# Patient Record
Sex: Female | Born: 1980 | Race: White | Hispanic: No | Marital: Single | Smoking: Current every day smoker
Health system: Southern US, Community
[De-identification: ages and names within clinical notes are randomized; demographics above are authoritative.]

## PROBLEM LIST (undated history)

## (undated) DIAGNOSIS — R51 Headache: Secondary | ICD-10-CM

## (undated) DIAGNOSIS — R519 Headache, unspecified: Secondary | ICD-10-CM

## (undated) DIAGNOSIS — G8929 Other chronic pain: Secondary | ICD-10-CM

## (undated) DIAGNOSIS — M542 Cervicalgia: Secondary | ICD-10-CM

## (undated) DIAGNOSIS — M549 Dorsalgia, unspecified: Secondary | ICD-10-CM

## (undated) HISTORY — PX: SHOULDER SURGERY: SHX246

## (undated) HISTORY — PX: CHOLECYSTECTOMY: SHX55

---

## 2013-11-14 ENCOUNTER — Ambulatory Visit (INDEPENDENT_AMBULATORY_CARE_PROVIDER_SITE_OTHER): Payer: BC Managed Care – PPO | Admitting: Family Medicine

## 2013-11-14 VITALS — BP 110/80 | HR 93 | Temp 98.3°F | Resp 16 | Ht 65.0 in | Wt 141.4 lb

## 2013-11-14 DIAGNOSIS — IMO0002 Reserved for concepts with insufficient information to code with codable children: Secondary | ICD-10-CM

## 2013-11-14 DIAGNOSIS — R200 Anesthesia of skin: Secondary | ICD-10-CM

## 2013-11-14 DIAGNOSIS — R209 Unspecified disturbances of skin sensation: Secondary | ICD-10-CM

## 2013-11-14 DIAGNOSIS — M792 Neuralgia and neuritis, unspecified: Secondary | ICD-10-CM | POA: Insufficient documentation

## 2013-11-14 MED ORDER — TRAMADOL HCL 50 MG PO TABS: 50.0000 mg | ORAL_TABLET | Freq: Three times a day (TID) | ORAL | Status: DC | PRN

## 2013-11-14 MED ORDER — METHYLPREDNISOLONE (PAK) 4 MG PO TABS: ORAL_TABLET | ORAL | Status: DC

## 2013-11-14 NOTE — Progress Notes (Signed)
This chart was scribed for Elvina Sidle, MD by Charline Bills, ED Scribe. The patient was seen in room 8. Patient's care was started at 9:27 AM.  Patient ID: Elizabeth Wong MRN: [id number], DOB: 1980-11-06, 33 y.o. Date of Encounter: 11/14/2013, 9:27 AM  Primary Physician: No PCP Per Patient  Chief Complaint  Patient presents with   Numbness    X 3 days, right shoulder down to right foot    HPI: 33 y.o. year old female with history below presents with intermittent R-sided pain over the past 2 years, returned 3 days ago. Pt states that she has pain from her R arm down to R knee. Pt reports dropping objects due to severity of pain. She also reports associated R-sided numbness, back pain, neck pain and HA.She also reports weakness and states that she has dropped objects before due to symptoms.   Pt states that she has followed up with her PCP for similar symptoms who placed her on Gabapentin with no relief. She has also tried ibuprofen, neurontin, cold compresses and warm baths with no relief. Pt reports waking up due to severity of pain.   She has been out of work for 3 days due to pain. Pt denies neck injury. She also denies family h/o similar symptoms. Pt has not seen a neurologist for symptoms. No h/o substance abuse. No h/o unusual infections.   Pt works at a [employer].   History reviewed. No pertinent past medical history.   Home Meds: Prior to Admission medications   Not on File    Allergies: No Known Allergies  History   Social History   Marital Status: Single    Spouse Name: N/A    Number of Children: N/A   Years of Education: N/A   Occupational History   Not on file.   Social History Main Topics   Smoking status: Current Every Day Smoker   Smokeless tobacco: Not on file   Alcohol Use: Yes   Drug Use: Not on file   Sexual Activity: Not on file   Other Topics Concern   Not on file   Social History Narrative   No narrative on file       Review of Systems: Constitutional: negative for chills, fever, night sweats, weight changes, or fatigue  HEENT: negative for vision changes, hearing loss, congestion, rhinorrhea, ST, epistaxis, or sinus pressure Cardiovascular: negative for chest pain or palpitations Respiratory: negative for hemoptysis, wheezing, shortness of breath, or cough Musculoskeletal: +neck pain, +back pain, +myalgias Abdominal: negative for abdominal pain, nausea, vomiting, diarrhea, or constipation Dermatological: negative for rash Neurologic: negative for dizziness, or syncope, +headache, +weakness, +numbness All other systems reviewed and are otherwise negative with the exception to those above and in the HPI.   Physical Exam: Blood pressure 110/80, pulse 93, temperature 98.3 F (36.8 C), temperature source Oral, resp. rate 16, height  (1.651 m), weight 141 lb 6.4 oz (64.139 kg), last menstrual period 10/23/2013, SpO2 100.00%., Body mass index is 23.53 kg/(m^2). General: Well developed, well nourished, in no acute distress. Head: Normocephalic, atraumatic, eyes without discharge, sclera non-icteric, nares are without discharge. Bilateral auditory canals clear, TM's are without perforation, pearly grey and translucent with reflective cone of light bilaterally. Oral cavity moist, posterior pharynx without exudate, erythema, peritonsillar abscess, or post nasal drip.  Neck: Supple. No thyromegaly. Full ROM. No lymphadenopathy. Lungs: Clear bilaterally to auscultation without wheezes, rales, or rhonchi. Breathing is unlabored. Heart: RRR with S1 S2. No murmurs, rubs, or  gallops appreciated. Abdomen: Soft, non-tender, non-distended with normoactive bowel sounds. No hepatomegaly. No rebound/guarding. No obvious abdominal masses. Msk:  Strength and tone normal for age Extremities/Skin: Warm and dry. No clubbing or cyanosis. No edema. No rashes or suspicious lesions. Neuro: Alert and oriented X 3. Moves all  extremities spontaneously. Gait is normal. CNII-XII grossly in tact, reflexes are normal Psych:  Responds to questions appropriately with a normal affect.   Labs:   ASSESSMENT AND PLAN:  33 y.o. year old female with   1. Hemisensory loss   2. Neuropathic pain   Hemisensory loss - Plan: MR Cervical Spine Wo Contrast, methylPREDNIsolone (MEDROL DOSPACK) 4 MG tablet, Ambulatory referral to Neurology  Neuropathic pain - Plan: MR Cervical Spine Wo Contrast, methylPREDNIsolone (MEDROL DOSPACK) 4 MG tablet, Ambulatory referral to Neurology, traMADol (ULTRAM) 50 MG tablet   Signed, Elvina Sidle, MD 11/14/2013 9:28 AM

## 2013-11-20 ENCOUNTER — Encounter: Payer: Self-pay | Admitting: Neurology

## 2013-11-20 ENCOUNTER — Telehealth: Payer: Self-pay | Admitting: *Deleted

## 2013-11-20 ENCOUNTER — Ambulatory Visit (INDEPENDENT_AMBULATORY_CARE_PROVIDER_SITE_OTHER): Payer: BC Managed Care – PPO | Admitting: Neurology

## 2013-11-20 ENCOUNTER — Telehealth: Payer: Self-pay

## 2013-11-20 VITALS — BP 103/67 | HR 76 | Ht 65.0 in | Wt 144.8 lb

## 2013-11-20 DIAGNOSIS — M79609 Pain in unspecified limb: Secondary | ICD-10-CM

## 2013-11-20 DIAGNOSIS — IMO0002 Reserved for concepts with insufficient information to code with codable children: Secondary | ICD-10-CM

## 2013-11-20 DIAGNOSIS — M79631 Pain in right forearm: Secondary | ICD-10-CM

## 2013-11-20 DIAGNOSIS — R209 Unspecified disturbances of skin sensation: Secondary | ICD-10-CM

## 2013-11-20 DIAGNOSIS — M792 Neuralgia and neuritis, unspecified: Secondary | ICD-10-CM

## 2013-11-20 DIAGNOSIS — R200 Anesthesia of skin: Secondary | ICD-10-CM

## 2013-11-20 MED ORDER — TOPIRAMATE 25 MG PO TABS: 25.0000 mg | ORAL_TABLET | Freq: Two times a day (BID) | ORAL | Status: DC

## 2013-11-20 MED ORDER — TRAMADOL HCL 50 MG PO TABS: 100.0000 mg | ORAL_TABLET | Freq: Four times a day (QID) | ORAL | Status: DC | PRN

## 2013-11-20 NOTE — Telephone Encounter (Signed)
Patient wanted to know by tomorrow if its still okay to get her MRI tomorrow? She is saying that she is seeing a neurologist that Dr. Milus Glazier referred her to. She was unsure her appointment is today to see the neurologist. I told her I was not sure what Dr. Milus Glazier wanted you to do both or not. I told her to also verify with neurologist she is seeing today. Please call as soon as possible her MRI is tomorrow at 6  Best: [phone]

## 2013-11-20 NOTE — Patient Instructions (Signed)
I had a long discussion with the patient with regards to her intermittent right body pain and paresthesias, discussed results of my examination, differential diagnosis, plan for evaluation and answered questions. I recommend she increase Ultram to 100 mg every 6 hours he when necessary and start Topamax 25 mg twice daily increase as tolerated to help with the pain and paresthesias. Check MRI scan of the brain in addition to MRI scan of the neck which is already scheduled for tomorrow. Check ANA panel, ESR, B12 and TSH. Return for followup in 6 weeks or call earlier if necessary.  Paresthesia Paresthesia is an abnormal burning or prickling sensation. This sensation is generally felt in the hands, arms, legs, or feet. However, it may occur in any part of the body. It is usually not painful. The feeling may be described as:  Tingling or numbness.  "Pins and needles."  Skin crawling.  Buzzing.  Limbs "falling asleep."  Itching. Most people experience temporary (transient) paresthesia at some time in their lives. CAUSES  Paresthesia may occur when you breathe too quickly (hyperventilation). It can also occur without any apparent cause. Commonly, paresthesia occurs when pressure is placed on a nerve. The feeling quickly goes away once the pressure is removed. For some people, however, paresthesia is a long-lasting (chronic) condition caused by an underlying disorder. The underlying disorder may be:  A traumatic, direct injury to nerves. Examples include a:  Broken (fractured) neck.  Fractured skull.  A disorder affecting the brain and spinal cord (central nervous system). Examples include:  Transverse myelitis.  Encephalitis.  Transient ischemic attack.  Multiple sclerosis.  Stroke.  Tumor or blood vessel problems, such as an arteriovenous malformation pressing against the brain or spinal cord.  A condition that damages the peripheral nerves (peripheral neuropathy). Peripheral  nerves are not part of the brain and spinal cord. These conditions include:  Diabetes.  Peripheral vascular disease.  Nerve entrapment syndromes, such as carpal tunnel syndrome.  Shingles.  Hypothyroidism.  Vitamin B12 deficiencies.  Alcoholism.  Heavy metal poisoning (lead, arsenic).  Rheumatoid arthritis.  Systemic lupus erythematosus. DIAGNOSIS  Your caregiver will attempt to find the underlying cause of your paresthesia. Your caregiver may:  Take your medical history.  Perform a physical exam.  Order various lab tests.  Order imaging tests. TREATMENT  Treatment for paresthesia depends on the underlying cause. HOME CARE INSTRUCTIONS  Avoid drinking alcohol.  You may consider massage or acupuncture to help relieve your symptoms.  Keep all follow-up appointments as directed by your caregiver. SEEK IMMEDIATE MEDICAL CARE IF:   You feel weak.  You have trouble walking or moving.  You have problems with speech or vision.  You feel confused.  You cannot control your bladder or bowel movements.  You feel numbness after an injury.  You faint.  Your burning or prickling feeling gets worse when walking.  You have pain, cramps, or dizziness.  You develop a rash. MAKE SURE YOU:  Understand these instructions.  Will watch your condition.  Will get help right away if you are not doing well or get worse. Document Released: 02/25/2002 Document Revised: 05/30/2011 Document Reviewed: 11/26/2010 [facility] Patient Information 2015 [place], [place]. This information is not intended to replace advice given to you by your health care provider. Make sure you discuss any questions you have with your health care provider.

## 2013-11-20 NOTE — Telephone Encounter (Signed)
Patient wanted to scheduled the Mri Dr. Pearlean Brownie  Wanted her to have with the one she was getting tomorrow.  Called  [facility] they stated that will be a two hour test so i got her rescheduled for 11th @ 5:45 . Called patient and gave her the appointment and she showed understanding.

## 2013-11-20 NOTE — Telephone Encounter (Signed)
Lm for rtn call if pt has any further questions.  Pt has MRI scheduled 9/11 for MRI- Neurology is following pt.

## 2013-11-21 ENCOUNTER — Inpatient Hospital Stay: Admission: RE | Admit: 2013-11-21 | Payer: Self-pay | Source: Ambulatory Visit

## 2013-11-21 LAB — COMPREHENSIVE METABOLIC PANEL WITH GFR
ALT: 11 [IU]/L (ref 0–32)
AST: 10 [IU]/L (ref 0–40)
Albumin/Globulin Ratio: 1.8 (ref 1.1–2.5)
Albumin: 4.1 g/dL (ref 3.5–5.5)
Alkaline Phosphatase: 39 [IU]/L (ref 39–117)
BUN/Creatinine Ratio: 18 (ref 8–20)
BUN: 16 mg/dL (ref 6–20)
CO2: 26 mmol/L (ref 18–29)
Calcium: 8.8 mg/dL (ref 8.7–10.2)
Chloride: 100 mmol/L (ref 97–108)
Creatinine, Ser: 0.89 mg/dL (ref 0.57–1.00)
GFR calc Af Amer: 98 mL/min/{1.73_m2}
GFR calc non Af Amer: 85 mL/min/{1.73_m2}
Globulin, Total: 2.3 g/dL (ref 1.5–4.5)
Glucose: 85 mg/dL (ref 65–99)
Potassium: 4.3 mmol/L (ref 3.5–5.2)
Sodium: 139 mmol/L (ref 134–144)
Total Bilirubin: 0.3 mg/dL (ref 0.0–1.2)
Total Protein: 6.4 g/dL (ref 6.0–8.5)

## 2013-11-21 LAB — CBC
HCT: 41.7 % (ref 34.0–46.6)
Hemoglobin: 14 g/dL (ref 11.1–15.9)
MCH: 32.2 pg (ref 26.6–33.0)
MCHC: 33.6 g/dL (ref 31.5–35.7)
MCV: 96 fL (ref 79–97)
Platelets: 265 10*3/uL (ref 150–379)
RBC: 4.35 x10E6/uL (ref 3.77–5.28)
RDW: 12.7 % (ref 12.3–15.4)
WBC: 9.5 10*3/uL (ref 3.4–10.8)

## 2013-11-21 LAB — SEDIMENTATION RATE: Sed Rate: 2 mm/hr (ref 0–32)

## 2013-11-21 LAB — ANA: Anti Nuclear Antibody(ANA): NEGATIVE

## 2013-11-21 LAB — VITAMIN B12: Vitamin B-12: 249 pg/mL (ref 211–946)

## 2013-11-21 LAB — TSH: TSH: 1.06 u[IU]/mL (ref 0.450–4.500)

## 2013-11-21 NOTE — Progress Notes (Signed)
[facility] [address] [place]. [place] [address] [phone]       OFFICE CONSULT NOTE  [name] Date of Birth:  02/23/1981 Medical Record Number:  [id number]   Referring MD:  Elizabeth Wong  Reason for Referral:  Right-sided numbness and pain  HPI: 75 year Caucasian lady  Who has had intermittent right sided pain and paresthesias for the last couple of years. She states her symptoms started back in [place] where she lived and has recently moved to [place] area. She is unable to give me any specific details about name of her doctors and testing that she has gone through. She was initially having intermittent pain and numbness episodes on the right side which began with pain involving the right cerebral neck and quickly involving the right arm as well as left. Most of the time the pain is an ache occasionally it is sharp and shooting pain particularly from the waist down. Sharp and last only a few seconds acetal pain can last for minutes to hours. She denies any weakness in her hand or legs. She also complains of numbness on the right side which is intermittent. She is unable to identify specific triggers or relieving factors. She denies any complaints symptoms in the form of headaches, double vision, vertigo, loss of vision, blurred vision. She has not noticed any trouble with her walking or balance or had any falls. She works 2 full-time jobs. She has an MRI scan of the cervical spine scheduled to be done tomorrow. She has not had any x-rays or other imaging studies done. She has never visited a pain specialist or neurologist. She has been started on Ultram but she has been taking only 50 mg 3 times a day with only suboptimal relief. She denies any prior history of strokes, TIAs, seizures or significant head injury or spinal injury.  ROS:   14 system review of systems is positive for leg swelling, headache, numbness, weakness, pain and all other systems  negative PMH: No past medical history on file.  Social History:  History   Social History  . Marital Status: Single    Spouse Name: N/A    Number of Children: 3  . Years of Education: college   Occupational History  . N/A    Social History Main Topics  . Smoking status: Current Every Day Smoker  . Smokeless tobacco: Not on file  . Alcohol Use: Yes  . Drug Use: No  . Sexual Activity: Yes   Other Topics Concern  . Not on file   Social History Narrative  . No narrative on file    Medications:   Current Outpatient Prescriptions on File Prior to Visit  Medication Sig Dispense Refill  . methylPREDNIsolone (MEDROL DOSPACK) 4 MG tablet follow package directions  21 tablet  0   No current facility-administered medications on file prior to visit.    Allergies:  No Known Allergies  Physical Exam General: well developed, well nourished, seated, in no evident distress Head: head normocephalic and atraumatic. Orohparynx benign Neck: supple with no carotid or supraclavicular bruits Cardiovascular: regular rate and rhythm, no murmurs Musculoskeletal: no deformity. Few spots of point tenderness right supra-spinatus , right buttock and paraspinal. Skin:  no rash/petichiae Vascular:  Normal pulses all extremities Filed Vitals:   11/20/13 1515  BP: 103/67  Pulse: 76    Neurologic Exam Mental Status: Awake and fully alert. Oriented to place and time. Recent and remote memory intact. Attention span, concentration  and fund of knowledge appropriate. Mood and affect appropriate.  Cranial Nerves: Fundoscopic exam reveals sharp disc margins. Pupils equal, briskly reactive to light. No afferent pupillary defect. Extraocular movements full without nystagmus. Visual fields full to confrontation. Hearing intact. Facial sensation intact. Face, tongue, palate moves normally and symmetrically.  Motor: Normal bulk and tone. Normal strength in all tested extremity muscles. Sensory.: Diminished  touch and pin prick sensation in the right arm and leg with splitting of the midline. Diminished vibration on the right side as well. Position sense is intact. Romberg sign is negative..  Coordination: Rapid alternating movements normal in all extremities. Finger-to-nose and heel-to-shin performed accurately bilaterally. Gait and Station: Arises from chair without difficulty. Stance is normal. Gait demonstrates normal stride length and balance . Able to heel, toe and tandem walk without difficulty.  Reflexes: 2+ brisk and symmetric. Toes downgoing.     ASSESSMENT: 89 year Caucasian lady with history of intermittent right-sided pain and paresthesias of unclear etiology. Neurological exam is significant only for brisk hyperreflexia and right hemisensory loss with slight nonorganic features. Evaluation for structural left brain lesion, cervical spine compressive disease, vasculitic and demyelinating disorders is necessary    PLAN: I had a long discussion with the patient with regards to her intermittent right body pain and paresthesias, discussed results of my examination, differential diagnosis, plan for evaluation and answered questions. I recommend she increase Ultram to 100 mg every 6 hours he when necessary and start Topamax 25 mg twice daily increase as tolerated to help with the pain and paresthesias. Check MRI scan of the brain in addition to MRI scan of the neck which is already scheduled for tomorrow. Check ANA panel, ESR, B12 and TSH. Return for followup in 6 weeks or call earlier if necessary.    Note: This document was prepared with digital dictation and possible smart phrase technology. Any transcriptional errors that result from this process are unintentional.

## 2013-11-22 ENCOUNTER — Telehealth: Payer: Self-pay

## 2013-11-22 NOTE — Telephone Encounter (Signed)
[facility] notified us they have approved our request for coverage on Topiramate effective until 11/21/2016 Ref # [id number]

## 2013-11-26 ENCOUNTER — Telehealth: Payer: Self-pay | Admitting: *Deleted

## 2013-11-26 DIAGNOSIS — M792 Neuralgia and neuritis, unspecified: Secondary | ICD-10-CM

## 2013-11-26 MED ORDER — TRAMADOL HCL 50 MG PO TABS: 100.0000 mg | ORAL_TABLET | Freq: Four times a day (QID) | ORAL | Status: DC | PRN

## 2013-11-26 NOTE — Telephone Encounter (Signed)
Pt calling, pharmacy has not gotten prescription.  I faxed to [phone]. Pt aware.

## 2013-11-26 NOTE — Telephone Encounter (Addendum)
I called pt and she just picked up the topamax.  She is ok with the refill on topamax.   I reordered, please sign.  Thanks

## 2013-11-26 NOTE — Telephone Encounter (Signed)
She just saw Dr Pearlean Brownie last week and needs to give the topamax time to work. If she needs a refill of the tramadol I can provide that but I am not adding anything new when she has not even tried the medication she was prescribed by Dr Pearlean Brownie.

## 2013-11-26 NOTE — Telephone Encounter (Signed)
Pt called and is having severe pain, waist down, bilateral legs.  Saw Dr.Sethi for neuropathic pain?  Work up in progress.  Took tramadol 2 tabs every 6 hours  ( takes the edge off).  Trouble getting topamax, lloks from note from [facility]/pharm on 11-22-13 approval given.   I called pharmacy and she can get today and start.  She feels like needs something else other then tramadol?  Or another refill since starting topamax?   Was crying yesterday was in so much pain.

## 2013-11-26 NOTE — Telephone Encounter (Signed)
I called pt back and let her know that topamax has been approved.  She can pick up at pharmacy.   She still wants something else.

## 2013-11-29 ENCOUNTER — Ambulatory Visit
Admission: RE | Admit: 2013-11-29 | Discharge: 2013-11-29 | Disposition: A | Discharge status: Home / Self Care | Payer: BC Managed Care – PPO | Source: Ambulatory Visit | Attending: Family Medicine | Admitting: Family Medicine

## 2013-11-29 ENCOUNTER — Telehealth: Payer: Self-pay | Admitting: *Deleted

## 2013-11-29 ENCOUNTER — Ambulatory Visit
Admission: RE | Admit: 2013-11-29 | Discharge: 2013-11-29 | Disposition: A | Discharge status: Home / Self Care | Payer: BC Managed Care – PPO | Source: Ambulatory Visit | Attending: Neurology | Admitting: Neurology

## 2013-11-29 DIAGNOSIS — R200 Anesthesia of skin: Secondary | ICD-10-CM

## 2013-11-29 DIAGNOSIS — M792 Neuralgia and neuritis, unspecified: Secondary | ICD-10-CM

## 2013-11-29 DIAGNOSIS — R209 Unspecified disturbances of skin sensation: Secondary | ICD-10-CM

## 2013-11-29 MED ORDER — GADOBENATE DIMEGLUMINE 529 MG/ML IV SOLN
13.0000 mL | Freq: Once | INTRAVENOUS | Status: AC | PRN
  Administered 2013-11-29: 13 mL via INTRAVENOUS

## 2013-11-29 MED ORDER — TRAMADOL HCL 50 MG PO TABS: 50.0000 mg | ORAL_TABLET | Freq: Three times a day (TID) | ORAL | Status: DC | PRN

## 2013-11-29 NOTE — Telephone Encounter (Signed)
Pt calling is asking for another refill of tramadol to get her thru the weekend.   She has started the topamax since 11-26-13 and she has noted she is better, bearable.

## 2013-11-29 NOTE — Telephone Encounter (Signed)
Rx was signed and faxed to the pharmacy.  Confirmation time stamped 4:39pm

## 2013-11-29 NOTE — Telephone Encounter (Signed)
Chart reviewed, she was given Tramadol  ii tabs every 6 hours, 30 tabs in Sep 3rd 2015.   Will give her Tramadol  one po q 8hrs, x 60 tabs, no refill, keep follow up in Oct 14

## 2013-11-29 NOTE — Telephone Encounter (Signed)
I called the patient back.  Got no answer.  Left message relaying Dr Yan's note.   

## 2013-12-02 ENCOUNTER — Other Ambulatory Visit: Payer: Self-pay | Admitting: Family Medicine

## 2013-12-02 ENCOUNTER — Telehealth: Payer: Self-pay | Admitting: *Deleted

## 2013-12-02 DIAGNOSIS — M542 Cervicalgia: Secondary | ICD-10-CM

## 2013-12-02 MED ORDER — GABAPENTIN 100 MG PO CAPS: 100.0000 mg | ORAL_CAPSULE | Freq: Every day | ORAL | Status: DC

## 2013-12-02 NOTE — Telephone Encounter (Signed)
Increase topamax to 50 mg twice daily

## 2013-12-02 NOTE — Telephone Encounter (Signed)
Pt continues with pain issues.  Given refill for tramadol  1 tab q 8 hrs #60.  Pt stated that this did not ease pain like the original order for 2 tabs every 6 hrs.  This is her third prescription for tramadol    She is taking the topamax  po bid.   Please advise.

## 2013-12-03 ENCOUNTER — Telehealth: Payer: Self-pay

## 2013-12-03 DIAGNOSIS — M792 Neuralgia and neuritis, unspecified: Secondary | ICD-10-CM

## 2013-12-03 MED ORDER — TRAMADOL HCL 50 MG PO TABS
50.0000 mg | ORAL_TABLET | Freq: Three times a day (TID) | ORAL | Status: DC | PRN
Start: 2013-12-03 — End: 2013-12-04

## 2013-12-03 NOTE — Telephone Encounter (Signed)
The MRI of the brain shows no significant abnormality.  The neck MRI shows a very mild swelling of the disc.  These findings can cause some neck stiffness, but do not account for her mental status changes.  I have written for some pain medicine for the neck.  If she becomes disoriented again, she needs to go to the emergency department

## 2013-12-03 NOTE — Telephone Encounter (Signed)
Patient calling regarding pain management. Patient wants to know if her provider received MRI results and also if he can give her tramadol for daytime use and something for the evening pain as well. Patient also wants to let provider know that when she turned her neck yesterday she forgot what she was doing/where she was at. Please return call. CB # [phone]

## 2013-12-03 NOTE — Telephone Encounter (Signed)
Calling about MRI results (address pain med tramadol).

## 2013-12-03 NOTE — Telephone Encounter (Signed)
Patient calling again to check on what she should do regarding her Tramadol medication, also is requesting MRI results. Please call and advise.

## 2013-12-04 NOTE — Telephone Encounter (Signed)
Spoke to pt and found that the tramadol was refilled for #60 yesterday through the neurologist. Advised pt that she can not have two offices manage her pain medication. Advised her to contact the neurologist office they have many notes in the system in regards to pain management. She states she will contact them.  I have destroyed the script for Tramadol written by Dr. Milus Glazier.

## 2013-12-04 NOTE — Telephone Encounter (Signed)
I called pt and and relayed that Dr. Pearlean Brownie has not addressed.  Was not able to get him prior to calling you back.  I relayed that topamax  po bid is what he stated in note.  Did not address tramadol or MRI.  Will ask him and let her know.   She related

## 2013-12-04 NOTE — Telephone Encounter (Signed)
I spoke to the patient and discuss results of MRI of the brain and cervical spine. She continues to have right-sided neck pain which is not relieved with tramadol and Topamax. Recommend referral to pain clinic and she is agreeable.

## 2013-12-05 ENCOUNTER — Other Ambulatory Visit: Payer: Self-pay | Admitting: *Deleted

## 2013-12-05 DIAGNOSIS — IMO0002 Reserved for concepts with insufficient information to code with codable children: Secondary | ICD-10-CM

## 2013-12-05 NOTE — Telephone Encounter (Signed)
I placed order for PM referral.

## 2013-12-05 NOTE — Progress Notes (Signed)
Per note from Dr. Pearlean Brownie, order for referral for Pain Management.

## 2013-12-11 ENCOUNTER — Other Ambulatory Visit: Payer: Self-pay | Admitting: Physical Medicine and Rehabilitation

## 2013-12-11 DIAGNOSIS — M545 Low back pain, unspecified: Secondary | ICD-10-CM

## 2013-12-17 ENCOUNTER — Other Ambulatory Visit: Payer: BC Managed Care – PPO

## 2013-12-18 ENCOUNTER — Telehealth: Payer: Self-pay

## 2013-12-18 DIAGNOSIS — M792 Neuralgia and neuritis, unspecified: Secondary | ICD-10-CM

## 2013-12-18 MED ORDER — DICLOFENAC SODIUM 75 MG PO TBEC
75.0000 mg | DELAYED_RELEASE_TABLET | Freq: Every morning | ORAL | Status: DC
Start: 2013-12-18 — End: 2015-10-12

## 2013-12-18 NOTE — Telephone Encounter (Signed)
DR. L - Pt said you told her to call back in regards to pain medication.  She said she had been doing ok until the past 4-5 days.  Says aleve and other similar medications are not helping.  The gabapentin helps at night, but she needs something during the day for the pain.  Please call [phone]

## 2013-12-18 NOTE — Telephone Encounter (Signed)
Pt advised.

## 2013-12-21 ENCOUNTER — Ambulatory Visit
Admission: RE | Admit: 2013-12-21 | Discharge: 2013-12-21 | Disposition: A | Discharge status: Home / Self Care | Payer: BC Managed Care – PPO | Source: Ambulatory Visit | Attending: Physical Medicine and Rehabilitation | Admitting: Physical Medicine and Rehabilitation

## 2013-12-21 DIAGNOSIS — M545 Low back pain, unspecified: Secondary | ICD-10-CM

## 2014-01-01 ENCOUNTER — Ambulatory Visit: Payer: Self-pay | Admitting: Neurology

## 2014-02-08 ENCOUNTER — Encounter (HOSPITAL_COMMUNITY): Payer: Self-pay | Admitting: Emergency Medicine

## 2014-02-08 ENCOUNTER — Emergency Department (HOSPITAL_COMMUNITY)
Admission: EM | Admit: 2014-02-08 | Discharge: 2014-02-09 | Disposition: A | Discharge status: Home / Self Care | Payer: BC Managed Care – PPO | Attending: Emergency Medicine | Admitting: Emergency Medicine

## 2014-02-08 DIAGNOSIS — Z79899 Other long term (current) drug therapy: Secondary | ICD-10-CM | POA: Insufficient documentation

## 2014-02-08 DIAGNOSIS — Z720 Tobacco use: Secondary | ICD-10-CM | POA: Diagnosis not present

## 2014-02-08 DIAGNOSIS — R509 Fever, unspecified: Secondary | ICD-10-CM | POA: Diagnosis present

## 2014-02-08 DIAGNOSIS — Z3202 Encounter for pregnancy test, result negative: Secondary | ICD-10-CM | POA: Insufficient documentation

## 2014-02-08 DIAGNOSIS — H53149 Visual discomfort, unspecified: Secondary | ICD-10-CM | POA: Insufficient documentation

## 2014-02-08 DIAGNOSIS — N12 Tubulo-interstitial nephritis, not specified as acute or chronic: Secondary | ICD-10-CM | POA: Insufficient documentation

## 2014-02-08 DIAGNOSIS — R51 Headache: Secondary | ICD-10-CM | POA: Insufficient documentation

## 2014-02-08 LAB — CBC WITH DIFFERENTIAL/PLATELET
Basophils Absolute: 0 10*3/uL (ref 0.0–0.1)
Basophils Relative: 0 % (ref 0–1)
Eosinophils Absolute: 0 10*3/uL (ref 0.0–0.7)
Eosinophils Relative: 0 % (ref 0–5)
HCT: 43.2 % (ref 36.0–46.0)
Hemoglobin: 14.7 g/dL (ref 12.0–15.0)
Lymphocytes Relative: 7 % — ABNORMAL LOW (ref 12–46)
Lymphs Abs: 1 10*3/uL (ref 0.7–4.0)
MCH: 32.1 pg (ref 26.0–34.0)
MCHC: 34 g/dL (ref 30.0–36.0)
MCV: 94.3 fL (ref 78.0–100.0)
Monocytes Absolute: 1.3 10*3/uL — ABNORMAL HIGH (ref 0.1–1.0)
Monocytes Relative: 9 % (ref 3–12)
Neutro Abs: 12.9 10*3/uL — ABNORMAL HIGH (ref 1.7–7.7)
Neutrophils Relative %: 84 % — ABNORMAL HIGH (ref 43–77)
Platelets: 213 10*3/uL (ref 150–400)
RBC: 4.58 MIL/uL (ref 3.87–5.11)
RDW: 12.1 % (ref 11.5–15.5)
WBC: 15.3 10*3/uL — ABNORMAL HIGH (ref 4.0–10.5)

## 2014-02-08 LAB — I-STAT CG4 LACTIC ACID, ED: Lactic Acid, Venous: 1.26 mmol/L (ref 0.5–2.2)

## 2014-02-08 LAB — URINALYSIS, ROUTINE W REFLEX MICROSCOPIC
Bilirubin Urine: NEGATIVE
Glucose, UA: NEGATIVE mg/dL
Ketones, ur: NEGATIVE mg/dL
Nitrite: POSITIVE — AB
Protein, ur: 100 mg/dL — AB
Specific Gravity, Urine: 1.016 (ref 1.005–1.030)
Urobilinogen, UA: 0.2 mg/dL (ref 0.0–1.0)
pH: 6 (ref 5.0–8.0)

## 2014-02-08 LAB — I-STAT CHEM 8, ED
BUN: 8 mg/dL (ref 6–23)
Calcium, Ion: 1.1 mmol/L — ABNORMAL LOW (ref 1.12–1.23)
Chloride: 100 mEq/L (ref 96–112)
Creatinine, Ser: 0.8 mg/dL (ref 0.50–1.10)
Glucose, Bld: 103 mg/dL — ABNORMAL HIGH (ref 70–99)
HCT: 48 % — ABNORMAL HIGH (ref 36.0–46.0)
Hemoglobin: 16.3 g/dL — ABNORMAL HIGH (ref 12.0–15.0)
Potassium: 3.6 mEq/L — ABNORMAL LOW (ref 3.7–5.3)
Sodium: 136 mEq/L — ABNORMAL LOW (ref 137–147)
TCO2: 25 mmol/L (ref 0–100)

## 2014-02-08 LAB — PREGNANCY, URINE: Preg Test, Ur: NEGATIVE

## 2014-02-08 LAB — POC URINE PREG, ED: Preg Test, Ur: NEGATIVE

## 2014-02-08 LAB — URINE MICROSCOPIC-ADD ON

## 2014-02-08 MED ORDER — DEXTROSE 5 % IV SOLN
1.0000 g | Freq: Once | INTRAVENOUS | Status: AC
  Administered 2014-02-08: 1 g via INTRAVENOUS
  Filled 2014-02-08: qty 10

## 2014-02-08 MED ORDER — SODIUM CHLORIDE 0.9 % IV BOLUS (SEPSIS)
1000.0000 mL | Freq: Once | INTRAVENOUS | Status: AC
  Administered 2014-02-08: 1000 mL via INTRAVENOUS

## 2014-02-08 MED ORDER — KETOROLAC TROMETHAMINE 30 MG/ML IJ SOLN
30.0000 mg | Freq: Once | INTRAMUSCULAR | Status: AC
  Administered 2014-02-08: 30 mg via INTRAVENOUS
  Filled 2014-02-08 (×2): qty 1

## 2014-02-08 MED ORDER — MORPHINE SULFATE 4 MG/ML IJ SOLN
4.0000 mg | Freq: Once | INTRAMUSCULAR | Status: AC
  Administered 2014-02-08: 4 mg via INTRAVENOUS
  Filled 2014-02-08: qty 1

## 2014-02-08 MED ORDER — ONDANSETRON HCL 4 MG/2ML IJ SOLN
4.0000 mg | Freq: Once | INTRAMUSCULAR | Status: AC
  Administered 2014-02-08: 4 mg via INTRAVENOUS
  Filled 2014-02-08: qty 2

## 2014-02-08 NOTE — ED Provider Notes (Signed)
CSN: [id number]     Arrival date & time 02/08/14  2216 History   First MD Initiated Contact with Patient 02/08/14 2307     Chief Complaint  Patient presents with  . Fever  . Back Pain     (Consider location/radiation/quality/duration/timing/severity/associated sxs/prior Treatment) HPI Patient presents with left flank pain starting yesterday. No radiation of the pain. No urinary symptoms specifically hematuria, dysuria, frequency or urgency. Patient also states she had fever to 104 today. She's been taking ibuprofen with mild relief. Patient also states that she is having a "migraine". Gradual onset, left-sided headache. Headache is similar to previous headaches. Described as throbbing. Mild photophobia. No nausea or vomiting. Patient denies any neck pain or stiffness. No vision changes. History reviewed. No pertinent past medical history. History reviewed. No pertinent past surgical history. Family History  Problem Relation Age of Onset  . Cancer Father     Colon   History  Substance Use Topics  . Smoking status: Current Every Day Smoker  . Smokeless tobacco: Not on file  . Alcohol Use: Yes   OB History    No data available     Review of Systems  Constitutional: Positive for fever, chills and fatigue.  HENT: Negative for congestion, rhinorrhea, sinus pressure and sore throat.   Eyes: Positive for photophobia. Negative for visual disturbance.  Respiratory: Negative for cough and shortness of breath.   Cardiovascular: Negative for chest pain.  Gastrointestinal: Negative for nausea, vomiting, abdominal pain and diarrhea.  Genitourinary: Positive for flank pain. Negative for dysuria, frequency, hematuria, vaginal bleeding, vaginal discharge and pelvic pain.  Musculoskeletal: Positive for back pain. Negative for myalgias, neck pain and neck stiffness.  Skin: Negative for rash and wound.  Neurological: Positive for headaches. Negative for dizziness, syncope, weakness and numbness.   All other systems reviewed and are negative.     Allergies  Review of patient's allergies indicates no known allergies.  Home Medications   Prior to Admission medications   Medication Sig Start Date End Date Taking? Authorizing Provider  ciprofloxacin (CIPRO) 500 MG tablet Take 1 tablet (500 mg total) by mouth 2 (two) times daily. One po bid x 7 days 02/09/14   Loren Raceravid Anyae Griffith, MD  diclofenac (VOLTAREN) 75 MG EC tablet Take 1 tablet (75 mg total) by mouth every morning. 12/18/13   Elvina SidleKurt Lauenstein, MD  gabapentin (NEURONTIN) 100 MG capsule Take 1 capsule (100 mg total) by mouth at bedtime. 12/02/13   Elvina SidleKurt Lauenstein, MD  HYDROcodone-acetaminophen (NORCO) 5-325 MG per tablet Take 1-2 tablets by mouth every 4 (four) hours as needed for severe pain. 02/09/14   Loren Raceravid Shamikia Linskey, MD  ibuprofen (ADVIL,MOTRIN) 600 MG tablet Take 1 tablet (600 mg total) by mouth every 6 (six) hours as needed for fever or mild pain. 02/09/14   Loren Raceravid Jakyla Reza, MD  methylPREDNIsolone (MEDROL DOSPACK) 4 MG tablet follow package directions 11/14/13   Elvina SidleKurt Lauenstein, MD  ondansetron [facility](ZOFRAN ODT) 4 MG disintegrating tablet 4mg  ODT q4 hours prn nausea/vomit 02/09/14   Loren Raceravid Marcelia Petersen, MD  topiramate (TOPAMAX) 25 MG tablet Take 1 tablet (25 mg total) by mouth 2 (two) times daily. 11/20/13   Delia HeadyPramod Sethi, MD   BP 95/50 mmHg  Pulse 80  Temp(Src) 98.7 F (37.1 C) (Oral)  Resp 19  Ht 5\' 6"  (1.676 m)  Wt 135 lb (61.236 kg)  BMI 21.80 kg/m2  SpO2 100%  LMP 02/02/2014 (Exact Date) Physical Exam  Constitutional: She is oriented to person, place, and time. She appears well-developed  and well-nourished. No distress.  HENT:  Head: Normocephalic and atraumatic.  Mouth/Throat: Oropharynx is clear and moist. No oropharyngeal exudate.  Eyes: EOM are normal. Pupils are equal, round, and reactive to light.  Neck: Normal range of motion. Neck supple.  No meningismus  Cardiovascular: Normal rate and regular rhythm.  Exam reveals no  gallop and no friction rub.   No murmur heard. Pulmonary/Chest: Effort normal and breath sounds normal. No respiratory distress. She has no wheezes. She has no rales. She exhibits no tenderness.  Abdominal: Soft. Bowel sounds are normal. She exhibits no distension and no mass. There is tenderness (mild tenderness to deep palpation of the left upper quadrant.). There is no rebound and no guarding.  Musculoskeletal: Normal range of motion. She exhibits no edema or tenderness.  Left flank tenderness with percussion.  Lymphadenopathy:    She has no cervical adenopathy.  Neurological: She is alert and oriented to person, place, and time.  Moves all extremities without deficit. Sensation is intact.  Skin: Skin is warm and dry. No rash noted. No erythema.  Psychiatric: She has a normal mood and affect. Her behavior is normal.  Nursing note and vitals reviewed.   ED Course  Procedures (including critical care time) Labs Review Labs Reviewed  CBC WITH DIFFERENTIAL - Abnormal; Notable for the following:    WBC 15.3 (*)    Neutrophils Relative % 84 (*)    Neutro Abs 12.9 (*)    Lymphocytes Relative 7 (*)    Monocytes Absolute 1.3 (*)    All other components within normal limits  URINALYSIS, ROUTINE W REFLEX MICROSCOPIC - Abnormal; Notable for the following:    APPearance TURBID (*)    Hgb urine dipstick MODERATE (*)    Protein, ur 100 (*)    Nitrite POSITIVE (*)    Leukocytes, UA LARGE (*)    All other components within normal limits  COMPREHENSIVE METABOLIC PANEL - Abnormal; Notable for the following:    Sodium 131 (*)    Chloride 94 (*)    Glucose, Bld 102 (*)    All other components within normal limits  URINE MICROSCOPIC-ADD ON - Abnormal; Notable for the following:    Bacteria, UA FEW (*)    All other components within normal limits  I-STAT CHEM 8, ED - Abnormal; Notable for the following:    Sodium 136 (*)    Potassium 3.6 (*)    Glucose, Bld 103 (*)    Calcium, Ion 1.10 (*)     Hemoglobin 16.3 (*)    HCT 48.0 (*)    All other components within normal limits  PREGNANCY, URINE  I-STAT CG4 LACTIC ACID, ED  POC URINE PREG, ED    Imaging Review No results found.   EKG Interpretation None      MDM   Final diagnoses:  Pyelonephritis    Clinical picture consistent with pyelonephritis. We'll begin fluids and antibiotics.  Patient states she is feeling much better after fluids and pain medication. We'll discharge home with oral antibiotics. Patient is advised to return immediately for worsening pain, persistent vomiting or for any concerns.    Loren Raceravid Eri Platten, MD 02/09/14 [phone]

## 2014-02-08 NOTE — ED Notes (Signed)
Pt arrived to the ED with a complaint of a fever,, back pain, headache and cough.  Pt has had back pain for a week and the other symptoms for 48 hours.  Pt states fever spiked today at 104.6.  Pt has taken tylenol with last dose at 1800 hrs today.

## 2014-02-09 DIAGNOSIS — N12 Tubulo-interstitial nephritis, not specified as acute or chronic: Secondary | ICD-10-CM | POA: Diagnosis not present

## 2014-02-09 LAB — COMPREHENSIVE METABOLIC PANEL
ALT: 12 U/L (ref 0–35)
AST: 16 U/L (ref 0–37)
Albumin: 3.5 g/dL (ref 3.5–5.2)
Alkaline Phosphatase: 43 U/L (ref 39–117)
Anion gap: 15 (ref 5–15)
BUN: 9 mg/dL (ref 6–23)
CO2: 22 mEq/L (ref 19–32)
Calcium: 9.6 mg/dL (ref 8.4–10.5)
Chloride: 94 mEq/L — ABNORMAL LOW (ref 96–112)
Creatinine, Ser: 0.78 mg/dL (ref 0.50–1.10)
GFR calc Af Amer: >90 mL/min (ref >90)
GFR calc non Af Amer: >90 mL/min (ref >90)
Glucose, Bld: 102 mg/dL — ABNORMAL HIGH (ref 70–99)
Potassium: 3.8 mEq/L (ref 3.7–5.3)
Sodium: 131 mEq/L — ABNORMAL LOW (ref 137–147)
Total Bilirubin: 0.5 mg/dL (ref 0.3–1.2)
Total Protein: 8.2 g/dL (ref 6.0–8.3)

## 2014-02-09 MED ORDER — HYDROCODONE-ACETAMINOPHEN 5-325 MG PO TABS: 1.0000 | ORAL_TABLET | ORAL | Status: DC | PRN

## 2014-02-09 MED ORDER — CIPROFLOXACIN HCL 500 MG PO TABS: 500.0000 mg | ORAL_TABLET | Freq: Two times a day (BID) | ORAL | Status: DC

## 2014-02-09 MED ORDER — IBUPROFEN 600 MG PO TABS: 600.0000 mg | ORAL_TABLET | Freq: Four times a day (QID) | ORAL | Status: DC | PRN

## 2014-02-09 MED ORDER — FENTANYL CITRATE 0.05 MG/ML IJ SOLN
50.0000 ug | Freq: Once | INTRAMUSCULAR | Status: AC
  Administered 2014-02-09: 50 ug via INTRAVENOUS
  Filled 2014-02-09: qty 2

## 2014-02-09 MED ORDER — ONDANSETRON 4 MG PO TBDP: ORAL_TABLET | ORAL | Status: DC

## 2014-02-09 MED ORDER — ACETAMINOPHEN 500 MG PO TABS
1000.0000 mg | ORAL_TABLET | Freq: Once | ORAL | Status: AC
  Administered 2014-02-09: 1000 mg via ORAL
  Filled 2014-02-09: qty 2

## 2014-02-09 NOTE — Discharge Instructions (Signed)
Pyelonephritis, Adult °Pyelonephritis is a kidney infection. In general, there are 2 main types of pyelonephritis: °· Infections that come on quickly without any warning (acute pyelonephritis). °· Infections that persist for a long period of time (chronic pyelonephritis). °CAUSES  °Two main causes of pyelonephritis are: °· Bacteria traveling from the bladder to the kidney. This is a problem especially in pregnant women. The urine in the bladder can become filled with bacteria from multiple causes, including: °¨ Inflammation of the prostate gland (prostatitis). °¨ Sexual intercourse in females. °¨ Bladder infection (cystitis). °· Bacteria traveling from the bloodstream to the tissue part of the kidney. °Problems that may increase your risk of getting a kidney infection include: °· Diabetes. °· Kidney stones or bladder stones. °· Cancer. °· Catheters placed in the bladder. °· Other abnormalities of the kidney or ureter. °SYMPTOMS  °· Abdominal pain. °· Pain in the side or flank area. °· Fever. °· Chills. °· Upset stomach. °· Blood in the urine (dark urine). °· Frequent urination. °· Strong or persistent urge to urinate. °· Burning or stinging when urinating. °DIAGNOSIS  °Your caregiver may diagnose your kidney infection based on your symptoms. A urine sample may also be taken. °TREATMENT  °In general, treatment depends on how severe the infection is.  °· If the infection is mild and caught early, your caregiver may treat you with oral antibiotics and send you home. °· If the infection is more severe, the bacteria may have gotten into the bloodstream. This will require intravenous (IV) antibiotics and a hospital stay. Symptoms may include: °¨ High fever. °¨ Severe flank pain. °¨ Shaking chills. °· Even after a hospital stay, your caregiver may require you to be on oral antibiotics for a period of time. °· Other treatments may be required depending upon the cause of the infection. °HOME CARE INSTRUCTIONS  °· Take your  antibiotics as directed. Finish them even if you start to feel better. °· Make an appointment to have your urine checked to make sure the infection is gone. °· Drink enough fluids to keep your urine clear or pale yellow. °· Take medicines for the bladder if you have urgency and frequency of urination as directed by your caregiver. °SEEK IMMEDIATE MEDICAL CARE IF:  °· You have a fever or persistent symptoms for more than 2-3 days. °· You have a fever and your symptoms suddenly get worse. °· You are unable to take your antibiotics or fluids. °· You develop shaking chills. °· You experience extreme weakness or fainting. °· There is no improvement after 2 days of treatment. °MAKE SURE YOU: °· Understand these instructions. °· Will watch your condition. °· Will get help right away if you are not doing well or get worse. °Document Released: 03/07/2005 Document Revised: 09/06/2011 Document Reviewed: 08/11/2010 °[employer]® Patient Information ©2015 [employer]. This information is not intended to replace advice given to you by your health care provider. Make sure you discuss any questions you have with your health care provider. ° °

## 2014-09-24 ENCOUNTER — Emergency Department (HOSPITAL_COMMUNITY): Payer: BLUE CROSS/BLUE SHIELD

## 2014-09-24 ENCOUNTER — Emergency Department (HOSPITAL_COMMUNITY)
Admission: EM | Admit: 2014-09-24 | Discharge: 2014-09-25 | Disposition: A | Discharge status: Home / Self Care | Payer: BLUE CROSS/BLUE SHIELD | Attending: Emergency Medicine | Admitting: Emergency Medicine

## 2014-09-24 ENCOUNTER — Encounter (HOSPITAL_COMMUNITY): Payer: Self-pay | Admitting: Emergency Medicine

## 2014-09-24 DIAGNOSIS — Z3202 Encounter for pregnancy test, result negative: Secondary | ICD-10-CM | POA: Insufficient documentation

## 2014-09-24 DIAGNOSIS — G8929 Other chronic pain: Secondary | ICD-10-CM | POA: Insufficient documentation

## 2014-09-24 DIAGNOSIS — K2970 Gastritis, unspecified, without bleeding: Secondary | ICD-10-CM | POA: Diagnosis not present

## 2014-09-24 DIAGNOSIS — M549 Dorsalgia, unspecified: Secondary | ICD-10-CM

## 2014-09-24 DIAGNOSIS — R109 Unspecified abdominal pain: Secondary | ICD-10-CM | POA: Diagnosis present

## 2014-09-24 DIAGNOSIS — Z720 Tobacco use: Secondary | ICD-10-CM | POA: Insufficient documentation

## 2014-09-24 DIAGNOSIS — M542 Cervicalgia: Secondary | ICD-10-CM | POA: Diagnosis not present

## 2014-09-24 LAB — COMPREHENSIVE METABOLIC PANEL
ALT: 14 U/L (ref 14–54)
AST: 15 U/L (ref 15–41)
Albumin: 4 g/dL (ref 3.5–5.0)
Alkaline Phosphatase: 36 U/L — ABNORMAL LOW (ref 38–126)
Anion gap: 7 (ref 5–15)
BUN: 14 mg/dL (ref 6–20)
CO2: 25 mmol/L (ref 22–32)
Calcium: 9.1 mg/dL (ref 8.9–10.3)
Chloride: 106 mmol/L (ref 101–111)
Creatinine, Ser: 0.69 mg/dL (ref 0.44–1.00)
GFR calc Af Amer: >60 mL/min (ref >60)
GFR calc non Af Amer: >60 mL/min (ref >60)
Glucose, Bld: 89 mg/dL (ref 65–99)
Potassium: 3.4 mmol/L — ABNORMAL LOW (ref 3.5–5.1)
Sodium: 138 mmol/L (ref 135–145)
Total Bilirubin: 0.8 mg/dL (ref 0.3–1.2)
Total Protein: 7.5 g/dL (ref 6.5–8.1)

## 2014-09-24 LAB — URINALYSIS, ROUTINE W REFLEX MICROSCOPIC
Bilirubin Urine: NEGATIVE
Glucose, UA: NEGATIVE mg/dL
Ketones, ur: NEGATIVE mg/dL
Leukocytes, UA: NEGATIVE
Nitrite: NEGATIVE
Protein, ur: NEGATIVE mg/dL
Specific Gravity, Urine: 1.004 — ABNORMAL LOW (ref 1.005–1.030)
Urobilinogen, UA: 0.2 mg/dL (ref 0.0–1.0)
pH: 6.5 (ref 5.0–8.0)

## 2014-09-24 LAB — CBC WITH DIFFERENTIAL/PLATELET
Basophils Absolute: 0 10*3/uL (ref 0.0–0.1)
Basophils Relative: 0 % (ref 0–1)
Eosinophils Absolute: 0.1 10*3/uL (ref 0.0–0.7)
Eosinophils Relative: 2 % (ref 0–5)
HCT: 41.1 % (ref 36.0–46.0)
Hemoglobin: 14 g/dL (ref 12.0–15.0)
Lymphocytes Relative: 29 % (ref 12–46)
Lymphs Abs: 2.3 10*3/uL (ref 0.7–4.0)
MCH: 32.5 pg (ref 26.0–34.0)
MCHC: 34.1 g/dL (ref 30.0–36.0)
MCV: 95.4 fL (ref 78.0–100.0)
Monocytes Absolute: 0.7 10*3/uL (ref 0.1–1.0)
Monocytes Relative: 9 % (ref 3–12)
Neutro Abs: 4.8 10*3/uL (ref 1.7–7.7)
Neutrophils Relative %: 60 % (ref 43–77)
Platelets: 203 10*3/uL (ref 150–400)
RBC: 4.31 MIL/uL (ref 3.87–5.11)
RDW: 12.2 % (ref 11.5–15.5)
WBC: 8 10*3/uL (ref 4.0–10.5)

## 2014-09-24 LAB — POC URINE PREG, ED: Preg Test, Ur: NEGATIVE

## 2014-09-24 LAB — URINE MICROSCOPIC-ADD ON

## 2014-09-24 MED ORDER — GI COCKTAIL ~~LOC~~
30.0000 mL | Freq: Once | ORAL | Status: AC
  Administered 2014-09-25: 30 mL via ORAL
  Filled 2014-09-24: qty 30

## 2014-09-24 MED ORDER — MORPHINE SULFATE 4 MG/ML IJ SOLN
4.0000 mg | Freq: Once | INTRAMUSCULAR | Status: AC
  Administered 2014-09-25: 4 mg via INTRAVENOUS
  Filled 2014-09-24: qty 1

## 2014-09-24 NOTE — ED Provider Notes (Signed)
CSN: [id number]     Arrival date & time 09/24/14  1915 History   First MD Initiated Contact with Patient 09/24/14 2307     Chief Complaint  Patient presents with  . Flank Pain     (Consider location/radiation/quality/duration/timing/severity/associated sxs/prior Treatment) HPI Patient presents with diffuse right-sided back and extremity pain. She states the pain has been on for the past 4 days. She says she has a "chill" in her right arm. The pain is worse with movement. She denies any nausea, vomiting, constipation or diarrhea. She's been taking Tylenol and ibuprofen for pain. She's had no fever or chills. She denies any head or neck trauma. She has no focal weakness or numbness. She denies any urinary incontinence. She has no hematuria, frequency, dysuria. History reviewed. No pertinent past medical history. History reviewed. No pertinent past surgical history. Family History  Problem Relation Age of Onset  . Cancer Father     Colon   History  Substance Use Topics  . Smoking status: Current Every Day Smoker  . Smokeless tobacco: Not on file  . Alcohol Use: Yes   OB History    No data available     Review of Systems  Constitutional: Negative for fever and chills.  Respiratory: Negative for shortness of breath.   Cardiovascular: Negative for chest pain.  Gastrointestinal: Positive for abdominal pain. Negative for nausea, vomiting and diarrhea.  Genitourinary: Negative for dysuria, frequency, hematuria, flank pain, vaginal bleeding, difficulty urinating and pelvic pain.  Musculoskeletal: Positive for myalgias, back pain and neck pain. Negative for neck stiffness.  Skin: Negative for rash and wound.  Neurological: Negative for dizziness, weakness, light-headedness, numbness and headaches.  All other systems reviewed and are negative.     Allergies  Review of patient's allergies indicates no known allergies.  Home Medications   Prior to Admission medications   Medication  Sig Start Date End Date Taking? Authorizing Provider  acetaminophen (TYLENOL) 500 MG tablet Take 500 mg by mouth every 6 (six) hours as needed for headache.   Yes Historical Provider, MD  ibuprofen (ADVIL,MOTRIN) 200 MG tablet Take 600 mg by mouth every 6 (six) hours as needed for moderate pain.   Yes Historical Provider, MD  ciprofloxacin (CIPRO) 500 MG tablet Take 1 tablet (500 mg total) by mouth 2 (two) times daily. One po bid x 7 days Patient not taking: Reported on 09/24/2014 02/09/14   Loren Racer, MD  diclofenac (VOLTAREN) 75 MG EC tablet Take 1 tablet (75 mg total) by mouth every morning. Patient not taking: Reported on 09/24/2014 12/18/13   Elvina Sidle, MD  gabapentin (NEURONTIN) 100 MG capsule Take 1 capsule (100 mg total) by mouth at bedtime. Patient not taking: Reported on 09/24/2014 12/02/13   Elvina Sidle, MD  HYDROcodone-acetaminophen [facility]) 5-325 MG per tablet Take 1-2 tablets by mouth every 4 (four) hours as needed for severe pain. Patient not taking: Reported on 09/24/2014 02/09/14   Loren Racer, MD  ibuprofen (ADVIL,MOTRIN) 600 MG tablet Take 1 tablet (600 mg total) by mouth every 6 (six) hours as needed for fever or mild pain. Patient not taking: Reported on 09/24/2014 02/09/14   Loren Racer, MD  lansoprazole (PREVACID) 30 MG capsule Take 1 capsule (30 mg total) by mouth daily at 12 noon. 09/25/14   Loren Racer, MD  methocarbamol (ROBAXIN) 500 MG tablet Take 2 tablets (1,000 mg total) by mouth every 8 (eight) hours as needed for muscle spasms. 09/25/14   Loren Racer, MD  methylPREDNIsolone (MEDROL [facility])  4 MG tablet follow package directions Patient not taking: Reported on 09/24/2014 11/14/13   Elvina Sidle, MD  ondansetron (ZOFRAN ODT) 4 MG disintegrating tablet 4mg  ODT q4 hours prn nausea/vomit Patient not taking: Reported on 09/24/2014 02/09/14   Loren Racer, MD  topiramate (TOPAMAX) 25 MG tablet Take 1 tablet (25 mg total) by mouth 2 (two) times  daily. Patient not taking: Reported on 09/24/2014 11/20/13   Micki Riley, MD   BP 107/65 mmHg  Pulse 96  Temp(Src) 98.5 F (36.9 C) (Oral)  Resp 16  SpO2 99%  LMP 09/03/2014 Physical Exam  Constitutional: She is oriented to person, place, and time. She appears well-developed and well-nourished. No distress.  HENT:  Head: Normocephalic and atraumatic.  Mouth/Throat: Oropharynx is clear and moist.  Eyes: EOM are normal. Pupils are equal, round, and reactive to light.  Neck: Normal range of motion. Neck supple.  No midline tenderness to palpation. Patient does have right cervical paraspinal tenderness. No meningismus.  Cardiovascular: Normal rate and regular rhythm.   Pulmonary/Chest: Effort normal and breath sounds normal. No respiratory distress. She has no wheezes. She has no rales. She exhibits no tenderness.  Abdominal: Soft. Bowel sounds are normal. She exhibits no distension and no mass. There is tenderness (epigastric tenderness to palpation.). There is no rebound and no guarding.  Musculoskeletal: Normal range of motion. She exhibits tenderness. She exhibits no edema.  Diffuse muscular tenderness of the thoracic back and lumbar on the right. There is no midline thoracic or lumbar tenderness. No CVA tenderness. Distal pulses intact. No calf swelling or tenderness.  Neurological: She is alert and oriented to person, place, and time.  5/5 motor in all extremities. Sensation is fully intact.  Skin: Skin is warm and dry. No rash noted. No erythema.  Psychiatric: She has a normal mood and affect. Her behavior is normal.  Nursing note and vitals reviewed.   ED Course  Procedures (including critical care time) Labs Review Labs Reviewed  COMPREHENSIVE METABOLIC PANEL - Abnormal; Notable for the following:    Potassium 3.4 (*)    Alkaline Phosphatase 36 (*)    All other components within normal limits  URINALYSIS, ROUTINE W REFLEX MICROSCOPIC (NOT AT [facility]) - Abnormal; Notable for the  following:    APPearance CLOUDY (*)    Specific Gravity, Urine 1.004 (*)    Hgb urine dipstick MODERATE (*)    All other components within normal limits  URINE MICROSCOPIC-ADD ON - Abnormal; Notable for the following:    Bacteria, UA FEW (*)    All other components within normal limits  CBC WITH DIFFERENTIAL/PLATELET  LIPASE, BLOOD  POC URINE PREG, ED    Imaging Review Ct Renal Stone Study  09/25/2014   CLINICAL DATA:  Acute onset of right flank pain for 2 days. Microhematuria. Initial encounter.  EXAM: CT ABDOMEN AND PELVIS WITHOUT CONTRAST  TECHNIQUE: Multidetector CT imaging of the abdomen and pelvis was performed following the standard protocol without IV contrast.  COMPARISON:  MRI of the lumbar spine performed 12/21/2013  FINDINGS: Calcified granulomata are noted at the right lung base. The lung bases are otherwise grossly clear.  The liver and spleen are unremarkable in appearance. The gallbladder is within normal limits. The pancreas and adrenal glands are unremarkable.  A few small nonobstructing bilateral renal stones are seen, measuring up to 3 mm in size. A 1.1 cm cyst is noted at the interpole region of the left kidney, with minimal peripheral calcification. No obstructing ureteral  stones are seen. There is no evidence of hydronephrosis. No perinephric stranding is appreciated.  No free fluid is identified. The small bowel is unremarkable in appearance. The stomach is within normal limits. No acute vascular abnormalities are seen.  The appendix is normal in caliber, without evidence for appendicitis. The colon is unremarkable in appearance.  The bladder is mildly distended and grossly unremarkable. The uterus is within normal limits. The ovaries are grossly symmetric. No suspicious adnexal masses are seen. No inguinal lymphadenopathy is seen.  No acute osseous abnormalities are identified.  IMPRESSION: 1. No acute abnormality seen within the abdomen or pelvis. 2. Few small nonobstructing  bilateral renal stones, measuring up to 3 mm in size. Small left renal cyst with minimal peripheral calcification.   Electronically Signed   By: Roanna RaiderJeffery  Chang M.D.   On: 09/25/2014 00:15     EKG Interpretation None      MDM   Final diagnoses:  Right flank pain  Chronic back pain  Gastritis    Patient with a normal neurologic exam. She has diffuse muscular tenderness over the right thoracic and lumbar musculature. Denies any vaginal bleeding. Denies gross blood in the urine. However on the UA there is moderate blood. Though this is not a typical presentation for kidney stones will get a CT renal study to exclude this. Abdominal exam likely due to gastritis. Patient says she's been taking ibuprofen regularly. This may be contributing to her pain.  Review of patient's records. MRI of cervical and lumbar spine last year. Minimal changes. Has been going to chronic pain pain management for her chronic right-sided pain but states she's not been recently. On further questioning pain is present for several years. Have encouraged her to decrease amount of ibuprofen she is taking. We'll add PPI to her regimen. She needs to continue to follow-up with chronic pain management. There does not appear to be any acute issues here. Patient is in no apparent distress. Ambulatory without difficulty.  Loren Raceravid Calib Wadhwa, MD 09/25/14 [phone]

## 2014-09-24 NOTE — ED Notes (Signed)
Pt c/o r side flank pain x2 days and chronic r side pain from head to toe.  Pt reports pain 10/10.  Denies urinary issues.

## 2014-09-25 DIAGNOSIS — K2970 Gastritis, unspecified, without bleeding: Secondary | ICD-10-CM | POA: Diagnosis not present

## 2014-09-25 LAB — LIPASE, BLOOD: Lipase: 30 U/L (ref 22–51)

## 2014-09-25 MED ORDER — LANSOPRAZOLE 30 MG PO CPDR: 30.0000 mg | DELAYED_RELEASE_CAPSULE | Freq: Every day | ORAL | Status: DC

## 2014-09-25 MED ORDER — METHOCARBAMOL 500 MG PO TABS: 1000.0000 mg | ORAL_TABLET | Freq: Three times a day (TID) | ORAL | Status: DC | PRN

## 2014-09-25 NOTE — Discharge Instructions (Signed)
Chronic Back Pain  When back pain lasts longer than 3 months, it is called chronic back pain.People with chronic back pain often go through certain periods that are more intense (flare-ups).  CAUSES Chronic back pain can be caused by wear and tear (degeneration) on different structures in your back. These structures include:  The bones of your spine (vertebrae) and the joints surrounding your spinal cord and nerve roots (facets).  The strong, fibrous tissues that connect your vertebrae (ligaments). Degeneration of these structures may result in pressure on your nerves. This can lead to constant pain. HOME CARE INSTRUCTIONS  Avoid bending, heavy lifting, prolonged sitting, and activities which make the problem worse.  Take brief periods of rest throughout the day to reduce your pain. Lying down or standing usually is better than sitting while you are resting.  Take over-the-counter or prescription medicines only as directed by your caregiver. SEEK IMMEDIATE MEDICAL CARE IF:   You have weakness or numbness in one of your legs or feet.  You have trouble controlling your bladder or bowels.  You have nausea, vomiting, abdominal pain, shortness of breath, or fainting. Document Released: 04/14/2004 Document Revised: 05/30/2011 Document Reviewed: 02/19/2011 [facility] Patient Information 2015 [place], [place]. This information is not intended to replace advice given to you by your health care provider. Make sure you discuss any questions you have with your health care provider.  Gastritis, Adult Gastritis is soreness and swelling (inflammation) of the lining of the stomach. Gastritis can develop as a sudden onset (acute) or long-term (chronic) condition. If gastritis is not treated, it can lead to stomach bleeding and ulcers. CAUSES  Gastritis occurs when the stomach lining is weak or damaged. Digestive juices from the stomach then inflame the weakened stomach lining. The stomach lining may be  weak or damaged due to viral or bacterial infections. One common bacterial infection is the Helicobacter pylori infection. Gastritis can also result from excessive alcohol consumption, taking certain medicines, or having too much acid in the stomach.  SYMPTOMS  In some cases, there are no symptoms. When symptoms are present, they may include:  Pain or a burning sensation in the upper abdomen.  Nausea.  Vomiting.  An uncomfortable feeling of fullness after eating. DIAGNOSIS  Your caregiver may suspect you have gastritis based on your symptoms and a physical exam. To determine the cause of your gastritis, your caregiver may perform the following:  Blood or stool tests to check for the H pylori bacterium.  Gastroscopy. A thin, flexible tube (endoscope) is passed down the esophagus and into the stomach. The endoscope has a light and camera on the end. Your caregiver uses the endoscope to view the inside of the stomach.  Taking a tissue sample (biopsy) from the stomach to examine under a microscope. TREATMENT  Depending on the cause of your gastritis, medicines may be prescribed. If you have a bacterial infection, such as an H pylori infection, antibiotics may be given. If your gastritis is caused by too much acid in the stomach, H2 blockers or antacids may be given. Your caregiver may recommend that you stop taking aspirin, ibuprofen, or other nonsteroidal anti-inflammatory drugs (NSAIDs). HOME CARE INSTRUCTIONS  Only take over-the-counter or prescription medicines as directed by your caregiver.  If you were given antibiotic medicines, take them as directed. Finish them even if you start to feel better.  Drink enough fluids to keep your urine clear or pale yellow.  Avoid foods and drinks that make your symptoms worse, such as:  Caffeine or alcoholic drinks.  Chocolate.  Peppermint or mint flavorings.  Garlic and onions.  Spicy foods.  Citrus fruits, such as oranges, lemons, or  limes.  Tomato-based foods such as sauce, chili, salsa, and pizza.  Fried and fatty foods.  Eat small, frequent meals instead of large meals. SEEK IMMEDIATE MEDICAL CARE IF:   You have black or dark red stools.  You vomit blood or material that looks like coffee grounds.  You are unable to keep fluids down.  Your abdominal pain gets worse.  You have a fever.  You do not feel better after 1 week.  You have any other questions or concerns. MAKE SURE YOU:  Understand these instructions.  Will watch your condition.  Will get help right away if you are not doing well or get worse. Document Released: 03/01/2001 Document Revised: 09/06/2011 Document Reviewed: 04/20/2011 [facility] Patient Information 2015 [place], [place]. This information is not intended to replace advice given to you by your health care provider. Make sure you discuss any questions you have with your health care provider.

## 2014-09-25 NOTE — ED Notes (Signed)
Patient is alert and oriented x3.  She was given DC instructions and follow up visit instructions.  Patient gave verbal understanding. She was DC ambulatory under her own power to home.  V/S stable.  He was not showing any signs of distress on DC 

## 2015-04-17 ENCOUNTER — Encounter (HOSPITAL_COMMUNITY): Payer: Self-pay

## 2015-04-17 ENCOUNTER — Emergency Department (HOSPITAL_COMMUNITY)
Admission: EM | Admit: 2015-04-17 | Discharge: 2015-04-17 | Disposition: A | Discharge status: Home / Self Care | Payer: BLUE CROSS/BLUE SHIELD | Attending: Emergency Medicine | Admitting: Emergency Medicine

## 2015-04-17 DIAGNOSIS — G43809 Other migraine, not intractable, without status migrainosus: Secondary | ICD-10-CM | POA: Insufficient documentation

## 2015-04-17 DIAGNOSIS — G8929 Other chronic pain: Secondary | ICD-10-CM | POA: Insufficient documentation

## 2015-04-17 DIAGNOSIS — F17200 Nicotine dependence, unspecified, uncomplicated: Secondary | ICD-10-CM | POA: Insufficient documentation

## 2015-04-17 DIAGNOSIS — Z79899 Other long term (current) drug therapy: Secondary | ICD-10-CM | POA: Diagnosis not present

## 2015-04-17 DIAGNOSIS — R51 Headache: Secondary | ICD-10-CM | POA: Diagnosis present

## 2015-04-17 HISTORY — DX: Dorsalgia, unspecified: M54.9

## 2015-04-17 HISTORY — DX: Headache: R51

## 2015-04-17 HISTORY — DX: Headache, unspecified: R51.9

## 2015-04-17 HISTORY — DX: Other chronic pain: G89.29

## 2015-04-17 HISTORY — DX: Cervicalgia: M54.2

## 2015-04-17 MED ORDER — PROCHLORPERAZINE EDISYLATE 5 MG/ML IJ SOLN
10.0000 mg | Freq: Once | INTRAMUSCULAR | Status: AC
  Administered 2015-04-17: 10 mg via INTRAVENOUS
  Filled 2015-04-17: qty 2

## 2015-04-17 MED ORDER — DEXAMETHASONE SODIUM PHOSPHATE 10 MG/ML IJ SOLN
10.0000 mg | Freq: Once | INTRAMUSCULAR | Status: AC
  Administered 2015-04-17: 10 mg via INTRAVENOUS
  Filled 2015-04-17: qty 1

## 2015-04-17 MED ORDER — SODIUM CHLORIDE 0.9 % IV BOLUS (SEPSIS)
1000.0000 mL | Freq: Once | INTRAVENOUS | Status: AC
  Administered 2015-04-17: 1000 mL via INTRAVENOUS

## 2015-04-17 MED ORDER — DIPHENHYDRAMINE HCL 50 MG/ML IJ SOLN
25.0000 mg | Freq: Once | INTRAMUSCULAR | Status: AC
  Administered 2015-04-17: 25 mg via INTRAVENOUS
  Filled 2015-04-17: qty 1

## 2015-04-17 MED ORDER — KETOROLAC TROMETHAMINE 30 MG/ML IJ SOLN
30.0000 mg | Freq: Once | INTRAMUSCULAR | Status: AC
  Administered 2015-04-17: 30 mg via INTRAVENOUS
  Filled 2015-04-17: qty 1

## 2015-04-17 MED ORDER — FENTANYL CITRATE (PF) 100 MCG/2ML IJ SOLN
50.0000 ug | Freq: Once | INTRAMUSCULAR | Status: AC
  Administered 2015-04-17: 50 ug via INTRAVENOUS
  Filled 2015-04-17: qty 2

## 2015-04-17 NOTE — Discharge Instructions (Signed)
Return here as needed.  Follow-up with your primary care doctor °

## 2015-04-17 NOTE — ED Provider Notes (Signed)
CSN: [id number]     Arrival date & time 04/17/15  1010 History   First MD Initiated Contact with Patient 04/17/15 1031     Chief Complaint  Patient presents with  . Headache     (Consider location/radiation/quality/duration/timing/severity/associated sxs/prior Treatment) HPI Patient presents to the emergency department with right groin headache since Monday.  The patient states the headache is progressively gotten worse over that time frame.  She states that she does get headaches from time to time similar to this.  The patient states that nothing seems make the condition better, states the lights and sound make the headache worse.  Patient states that she has not had any numbness, weakness, dizziness, blurred vision, back pain, neck pain, fever, abdominal pain, chest pain, shortness of breath, rash, near syncope or syncope.  The patient states that she took Tylenol, Motrin and Aleve at home without relief of her symptoms Past Medical History  Diagnosis Date  . Headache   . Back pain   . Neck pain   . Chronic pain     Pain Clinic Patient    History reviewed. No pertinent past surgical history. Family History  Problem Relation Age of Onset  . Cancer Father     Colon   Social History  Substance Use Topics  . Smoking status: Current Every Day Smoker  . Smokeless tobacco: None  . Alcohol Use: Yes   OB History    No data available     Review of Systems   All other systems negative except as documented in the HPI. All pertinent positives and negatives as reviewed in the HPI. Allergies  Review of patient's allergies indicates no known allergies.  Home Medications   Prior to Admission medications   Medication Sig Start Date End Date Taking? Authorizing Provider  acetaminophen (TYLENOL) 500 MG tablet Take 1,000 mg by mouth every 6 (six) hours as needed for headache.    Yes Historical Provider, MD  ibuprofen (ADVIL,MOTRIN) 200 MG tablet Take 600-800 mg by mouth every 6 (six)  hours as needed for headache or moderate pain.   Yes Historical Provider, MD  naproxen sodium (ANAPROX) 220 MG tablet Take 440 mg by mouth 2 (two) times daily as needed (headaches).   Yes Historical Provider, MD  diclofenac (VOLTAREN) 75 MG EC tablet Take 1 tablet (75 mg total) by mouth every morning. Patient not taking: Reported on 09/24/2014 12/18/13   Elvina Sidle, MD  gabapentin (NEURONTIN) 100 MG capsule Take 1 capsule (100 mg total) by mouth at bedtime. Patient not taking: Reported on 09/24/2014 12/02/13   Elvina Sidle, MD  HYDROcodone-acetaminophen [facility]) 5-325 MG per tablet Take 1-2 tablets by mouth every 4 (four) hours as needed for severe pain. Patient not taking: Reported on 09/24/2014 02/09/14   Loren Racer, MD  lansoprazole (PREVACID) 30 MG capsule Take 1 capsule (30 mg total) by mouth daily at 12 noon. 09/25/14   Loren Racer, MD  methocarbamol (ROBAXIN) 500 MG tablet Take 2 tablets (1,000 mg total) by mouth every 8 (eight) hours as needed for muscle spasms. 09/25/14   Loren Racer, MD  ondansetron (ZOFRAN ODT) 4 MG disintegrating tablet  ODT q4 hours prn nausea/vomit Patient not taking: Reported on 09/24/2014 02/09/14   Loren Racer, MD  topiramate (TOPAMAX) 25 MG tablet Take 1 tablet (25 mg total) by mouth 2 (two) times daily. Patient not taking: Reported on 09/24/2014 11/20/13   Micki Riley, MD   BP 107/94 mmHg  Pulse 72  Temp(Src) 97.5  F (36.4 C) (Oral)  Resp 16  SpO2 99%  LMP 03/22/2015 Physical Exam  Constitutional: She is oriented to person, place, and time. She appears well-developed and well-nourished. No distress.  HENT:  Head: Normocephalic and atraumatic.  Mouth/Throat: Oropharynx is clear and moist.  Eyes: Pupils are equal, round, and reactive to light.  Neck: Normal range of motion. Neck supple.  Cardiovascular: Normal rate, regular rhythm and normal heart sounds.  Exam reveals no gallop and no friction rub.   No murmur heard. Pulmonary/Chest:  Effort normal and breath sounds normal. No respiratory distress. She has no wheezes.  Abdominal: Soft. Bowel sounds are normal. She exhibits no distension. There is no tenderness.  Neurological: She is alert and oriented to person, place, and time. She exhibits normal muscle tone. Coordination normal.  Skin: Skin is warm and dry. No rash noted. No erythema.  Psychiatric: She has a normal mood and affect. Her behavior is normal.  Nursing note and vitals reviewed.   ED Course  Procedures (including critical care time) Labs Review Labs Reviewed - No data to display  Imaging Review No results found. I have personally reviewed and evaluated these images and lab results as part of my medical decision-making.    Patient's headache is some better.  We will have the patient follow up with her primary care Dr. told to return here as needed.  Patient agrees the plan and all questions were answered.  Patient does not have any neurological deficits.  She states initially after the first try to treatment, her headache went from a tenderness 7 ounces down even lower.  The patient was receiving IV fluids as well  Charlestine Night, PA-C 04/17/15 1416  Loren Racer, MD 04/17/15 1536

## 2015-04-17 NOTE — ED Notes (Signed)
Bed: WA22 Expected date:  Expected time:  Means of arrival:  Comments: 

## 2015-04-17 NOTE — ED Notes (Signed)
Pt c/o of generalized HA since Monday. Pain has improved however now has increased pain with nausea and dizziness. Blurred vision and light sensitivity present with this HA however unsure if these symptoms were present with previous HA. Pt states she has had this type HA in the past however has not been DX with migraines. Denies fever and is neuro intact

## 2015-10-12 ENCOUNTER — Emergency Department (HOSPITAL_COMMUNITY): Payer: BLUE CROSS/BLUE SHIELD

## 2015-10-12 ENCOUNTER — Encounter (HOSPITAL_COMMUNITY): Payer: Self-pay | Admitting: Emergency Medicine

## 2015-10-12 ENCOUNTER — Emergency Department (HOSPITAL_COMMUNITY)
Admission: EM | Admit: 2015-10-12 | Discharge: 2015-10-12 | Disposition: A | Discharge status: Home / Self Care | Payer: BLUE CROSS/BLUE SHIELD | Attending: Emergency Medicine | Admitting: Emergency Medicine

## 2015-10-12 DIAGNOSIS — R1013 Epigastric pain: Secondary | ICD-10-CM | POA: Insufficient documentation

## 2015-10-12 DIAGNOSIS — N390 Urinary tract infection, site not specified: Secondary | ICD-10-CM | POA: Diagnosis not present

## 2015-10-12 DIAGNOSIS — R42 Dizziness and giddiness: Secondary | ICD-10-CM | POA: Diagnosis present

## 2015-10-12 DIAGNOSIS — R109 Unspecified abdominal pain: Secondary | ICD-10-CM

## 2015-10-12 DIAGNOSIS — F17200 Nicotine dependence, unspecified, uncomplicated: Secondary | ICD-10-CM | POA: Diagnosis not present

## 2015-10-12 LAB — COMPREHENSIVE METABOLIC PANEL
ALT: 13 U/L — ABNORMAL LOW (ref 14–54)
AST: 13 U/L — ABNORMAL LOW (ref 15–41)
Albumin: 4 g/dL (ref 3.5–5.0)
Alkaline Phosphatase: 31 U/L — ABNORMAL LOW (ref 38–126)
Anion gap: 7 (ref 5–15)
BUN: 13 mg/dL (ref 6–20)
CO2: 24 mmol/L (ref 22–32)
Calcium: 8.9 mg/dL (ref 8.9–10.3)
Chloride: 108 mmol/L (ref 101–111)
Creatinine, Ser: 0.68 mg/dL (ref 0.44–1.00)
GFR calc Af Amer: >60 mL/min (ref >60)
GFR calc non Af Amer: >60 mL/min (ref >60)
Glucose, Bld: 92 mg/dL (ref 65–99)
Potassium: 3.5 mmol/L (ref 3.5–5.1)
Sodium: 139 mmol/L (ref 135–145)
Total Bilirubin: 0.9 mg/dL (ref 0.3–1.2)
Total Protein: 6.9 g/dL (ref 6.5–8.1)

## 2015-10-12 LAB — URINALYSIS, ROUTINE W REFLEX MICROSCOPIC
Bilirubin Urine: NEGATIVE
Glucose, UA: NEGATIVE mg/dL
Ketones, ur: NEGATIVE mg/dL
Nitrite: NEGATIVE
Protein, ur: NEGATIVE mg/dL
Specific Gravity, Urine: 1.008 (ref 1.005–1.030)
pH: 6 (ref 5.0–8.0)

## 2015-10-12 LAB — CBC WITH DIFFERENTIAL/PLATELET
Basophils Absolute: 0 10*3/uL (ref 0.0–0.1)
Basophils Relative: 0 %
Eosinophils Absolute: 0.1 10*3/uL (ref 0.0–0.7)
Eosinophils Relative: 1 %
HCT: 39.7 % (ref 36.0–46.0)
Hemoglobin: 13.6 g/dL (ref 12.0–15.0)
Lymphocytes Relative: 20 %
Lymphs Abs: 1.5 10*3/uL (ref 0.7–4.0)
MCH: 32.4 pg (ref 26.0–34.0)
MCHC: 34.3 g/dL (ref 30.0–36.0)
MCV: 94.5 fL (ref 78.0–100.0)
Monocytes Absolute: 0.6 10*3/uL (ref 0.1–1.0)
Monocytes Relative: 8 %
Neutro Abs: 5.4 10*3/uL (ref 1.7–7.7)
Neutrophils Relative %: 71 %
Platelets: 219 10*3/uL (ref 150–400)
RBC: 4.2 MIL/uL (ref 3.87–5.11)
RDW: 12.4 % (ref 11.5–15.5)
WBC: 7.6 10*3/uL (ref 4.0–10.5)

## 2015-10-12 LAB — URINE MICROSCOPIC-ADD ON

## 2015-10-12 LAB — LIPASE, BLOOD: Lipase: 23 U/L (ref 11–51)

## 2015-10-12 MED ORDER — SODIUM CHLORIDE 0.9 % IV BOLUS (SEPSIS)
1000.0000 mL | Freq: Once | INTRAVENOUS | Status: AC
  Administered 2015-10-12: 1000 mL via INTRAVENOUS

## 2015-10-12 MED ORDER — CEPHALEXIN 500 MG PO CAPS: 500.0000 mg | ORAL_CAPSULE | Freq: Four times a day (QID) | ORAL | 0 refills | Status: DC

## 2015-10-12 MED ORDER — HYDROMORPHONE HCL 1 MG/ML IJ SOLN
0.5000 mg | Freq: Once | INTRAMUSCULAR | Status: AC
  Administered 2015-10-12: 0.5 mg via INTRAVENOUS
  Filled 2015-10-12: qty 1

## 2015-10-12 MED ORDER — KETOROLAC TROMETHAMINE 15 MG/ML IJ SOLN
15.0000 mg | Freq: Once | INTRAMUSCULAR | Status: DC
  Filled 2015-10-12: qty 1

## 2015-10-12 MED ORDER — FAMOTIDINE IN NACL 20-0.9 MG/50ML-% IV SOLN
20.0000 mg | Freq: Once | INTRAVENOUS | Status: AC
  Administered 2015-10-12: 20 mg via INTRAVENOUS
  Filled 2015-10-12: qty 50

## 2015-10-12 MED ORDER — HYDROMORPHONE HCL 1 MG/ML IJ SOLN
1.0000 mg | Freq: Once | INTRAMUSCULAR | Status: AC
  Administered 2015-10-12: 1 mg via INTRAVENOUS
  Filled 2015-10-12: qty 1

## 2015-10-12 MED ORDER — OXYCODONE-ACETAMINOPHEN 5-325 MG PO TABS: 1.0000 | ORAL_TABLET | ORAL | 0 refills | Status: DC | PRN

## 2015-10-12 MED ORDER — PANTOPRAZOLE SODIUM 40 MG PO TBEC
40.0000 mg | DELAYED_RELEASE_TABLET | Freq: Once | ORAL | Status: AC
  Administered 2015-10-12: 40 mg via ORAL
  Filled 2015-10-12: qty 1

## 2015-10-12 MED ORDER — DEXTROSE 5 % IV SOLN
1.0000 g | Freq: Once | INTRAVENOUS | Status: AC
  Administered 2015-10-12: 1 g via INTRAVENOUS
  Filled 2015-10-12: qty 10

## 2015-10-12 NOTE — ED Notes (Signed)
Ultrasound at bedside

## 2015-10-12 NOTE — ED Notes (Signed)
Discharge instructions, follow up care, and rx x2 reviewed with patient. Patient verbalized understanding. 

## 2015-10-12 NOTE — ED Provider Notes (Signed)
[facility]-EMERGENCY DEPT Provider Note   CSN: [id number] Arrival date & time: 10/12/15  1016  First Provider Contact:  None    By signing my name below, I, Placido Sou, attest that this documentation has been prepared under the direction and in the presence of Raeford Razor, MD. Electronically Signed: Placido Sou, ED Scribe. 10/12/15. 1:02 PM.   History   Chief Complaint Chief Complaint  Patient presents with  . Dizziness    HPI HPI Comments: Elizabeth Wong is a 35 y.o. female who presents to the Emergency Department complaining of intermittent, mild, dizziness x 5 days. She was dx with kidney stones 2 weeks ago at [facility] and was experiencing flank pain and hematuria and was d/c with Flomax and pain medication which have provided mild relief. She states she began experiencing lightheadedness and intermittent dizziness 5 days ago which alleviated slightly over the weekend and began once again today. She reports associated n/v, back pain, epigastric pain, right flank pain, fever, chills, dark urine last night and nocturnal hyperhidrosis. Her symptoms worsen with movement and intermittently 5-10 minutes s/p eating. She reports a hx of kidney infections and flank pain but denies she typically experiences dizziness and lightheadedness with her symptoms. She denies SOB and dysuria.   The history is provided by the patient and a friend. No language interpreter was used.    Past Medical History:  Diagnosis Date  . Back pain   . Chronic pain    Pain Clinic Patient   . Headache   . Neck pain     Patient Active Problem List   Diagnosis Date Noted  . Chronic pain   . Disturbance of skin sensation 11/20/2013  . Numbness 11/20/2013  . Pain in limb 11/20/2013  . Neuropathic pain 11/14/2013    History reviewed. No pertinent surgical history.  OB History    No data available       Home Medications    Prior to Admission medications   Medication Sig Start Date End Date Taking?  Authorizing Provider  acetaminophen (TYLENOL) 500 MG tablet Take 1,000 mg by mouth every 6 (six) hours as needed for headache.     Historical Provider, MD  diclofenac (VOLTAREN) 75 MG EC tablet Take 1 tablet (75 mg total) by mouth every morning. Patient not taking: Reported on 09/24/2014 12/18/13   Elvina Sidle, MD  gabapentin (NEURONTIN) 100 MG capsule Take 1 capsule (100 mg total) by mouth at bedtime. Patient not taking: Reported on 09/24/2014 12/02/13   Elvina Sidle, MD  HYDROcodone-acetaminophen [facility]) 5-325 MG per tablet Take 1-2 tablets by mouth every 4 (four) hours as needed for severe pain. Patient not taking: Reported on 09/24/2014 02/09/14   Loren Racer, MD  ibuprofen (ADVIL,MOTRIN) 200 MG tablet Take 600-800 mg by mouth every 6 (six) hours as needed for headache or moderate pain.    Historical Provider, MD  lansoprazole (PREVACID) 30 MG capsule Take 1 capsule (30 mg total) by mouth daily at 12 noon. 09/25/14   Loren Racer, MD  methocarbamol (ROBAXIN) 500 MG tablet Take 2 tablets (1,000 mg total) by mouth every 8 (eight) hours as needed for muscle spasms. 09/25/14   Loren Racer, MD  naproxen sodium (ANAPROX) 220 MG tablet Take 440 mg by mouth 2 (two) times daily as needed (headaches).    Historical Provider, MD  ondansetron (ZOFRAN ODT) 4 MG disintegrating tablet  ODT q4 hours prn nausea/vomit Patient not taking: Reported on 09/24/2014 02/09/14   Loren Racer, MD  topiramate (TOPAMAX) 25  MG tablet Take 1 tablet (25 mg total) by mouth 2 (two) times daily. Patient not taking: Reported on 09/24/2014 11/20/13   Micki Riley, MD    Family History Family History  Problem Relation Age of Onset  . Cancer Father     Colon    Social History Social History  Substance Use Topics  . Smoking status: Current Every Day Smoker  . Smokeless tobacco: Current User  . Alcohol use Yes     Allergies   Review of patient's allergies indicates no known allergies.   Review of  Systems Review of Systems  Constitutional: Positive for chills and fever.  Respiratory: Negative for shortness of breath.   Gastrointestinal: Positive for abdominal pain, nausea and vomiting.  Genitourinary: Positive for flank pain. Negative for dysuria.  Musculoskeletal: Positive for back pain.  Neurological: Positive for dizziness and light-headedness.  All other systems reviewed and are negative.    Physical Exam Updated Vital Signs BP 112/79 (BP Location: Left Arm)   Pulse 79   Temp 98.6 F (37 C) (Oral)   Resp 17   SpO2 100%   Physical Exam  Constitutional: She is oriented to person, place, and time. She appears well-developed and well-nourished.  HENT:  Head: Normocephalic.  Eyes: EOM are normal.  Neck: Normal range of motion.  Pulmonary/Chest: Effort normal.  Abdominal: Soft. She exhibits no distension. There is tenderness in the epigastric area. There is CVA tenderness.  Mild epigastric tenderness and right CVA tenderness  Musculoskeletal: Normal range of motion.  Neurological: She is alert and oriented to person, place, and time.  Psychiatric: She has a normal mood and affect.  Nursing note and vitals reviewed.   ED Treatments / Results  Labs (all labs ordered are listed, but only abnormal results are displayed) Labs Reviewed  URINALYSIS, ROUTINE W REFLEX MICROSCOPIC (NOT AT [facility]) - Abnormal; Notable for the following:       Result Value   APPearance CLOUDY (*)    Hgb urine dipstick SMALL (*)    Leukocytes, UA SMALL (*)    All other components within normal limits  URINE MICROSCOPIC-ADD ON - Abnormal; Notable for the following:    Squamous Epithelial / LPF 6-30 (*)    Bacteria, UA MANY (*)    All other components within normal limits  COMPREHENSIVE METABOLIC PANEL - Abnormal; Notable for the following:    AST 13 (*)    ALT 13 (*)    Alkaline Phosphatase 31 (*)    All other components within normal limits  URINE CULTURE  LIPASE, BLOOD  CBC WITH  DIFFERENTIAL/PLATELET    EKG  EKG Interpretation None       Radiology No results found.  Procedures Procedures  DIAGNOSTIC STUDIES: Oxygen Saturation is 100% on RA, normal by my interpretation.    COORDINATION OF CARE: 1:00 PM Discussed next steps with pt. Pt verbalized understanding and is agreeable with the plan.    Medications Ordered in ED Medications - No data to display   Initial Impression / Assessment and Plan / ED Course  I have reviewed the triage vital signs and the nursing notes.  Pertinent labs & imaging results that were available during my care of the patient were reviewed by me and considered in my medical decision making (see chart for details).  Clinical Course  Value Comment By Time  Bacteria, UA: (!) MANY (Reviewed) Raeford Razor, MD 07/24 1523    35yF with R flank pain. Appears to be chronic in  nature although UA today is consistent with UTI. With worsening of her symptoms, will treat. Send culture. She is afebrile and HD stable. EKG unremarkable. It has been determined that no acute conditions requiring further emergency intervention are present at this time. The patient has been advised of the diagnosis and plan. I reviewed any labs and imaging including any potential incidental findings. We have discussed signs and symptoms that warrant return to the ED and they are listed in the discharge instructions.    I personally preformed the services scribed in my presence. The recorded information has been reviewed is accurate. Raeford Razor, MD.   Final Clinical Impressions(s) / ED Diagnoses   Final diagnoses:  UTI (lower urinary tract infection)  Right flank pain    New Prescriptions New Prescriptions   No medications on file     Raeford Razor, MD 10/12/15 1537

## 2015-10-12 NOTE — ED Triage Notes (Signed)
Patient here from home with complaints of dizziness today. Reports being diagnosed with kidney stone and given flowmax and pain med reports some relief. States that she feels like the kidney stone has not passed.

## 2015-10-12 NOTE — ED Notes (Signed)
Bed: WA24 Expected date:  Expected time:  Means of arrival:  Comments: Need for [facility]

## 2016-12-12 DIAGNOSIS — M5430 Sciatica, unspecified side: Secondary | ICD-10-CM | POA: Diagnosis not present

## 2016-12-15 DIAGNOSIS — M549 Dorsalgia, unspecified: Secondary | ICD-10-CM | POA: Diagnosis not present

## 2016-12-15 DIAGNOSIS — N3000 Acute cystitis without hematuria: Secondary | ICD-10-CM | POA: Diagnosis not present

## 2017-09-21 IMAGING — US US ABDOMEN LIMITED
1 series · 14 of 25 positions shown · non-contrast
Comparison: CT abdomen pelvis 09/29/2015.

CLINICAL DATA: Patient with right upper quadrant pain for several
days.

EXAM:
US ABDOMEN LIMITED - RIGHT UPPER QUADRANT

[Series 1: us abdomen limited · 0.15mm/px · 14 of 45 slices shown]
[im 1/45]
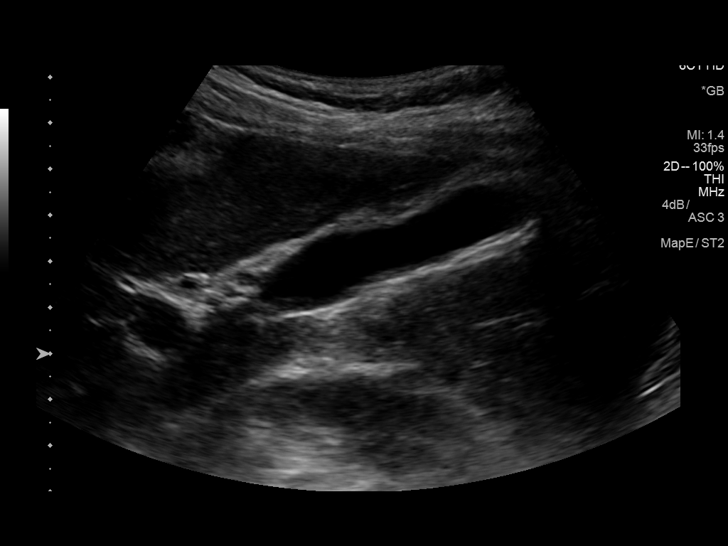
[im 4/45]
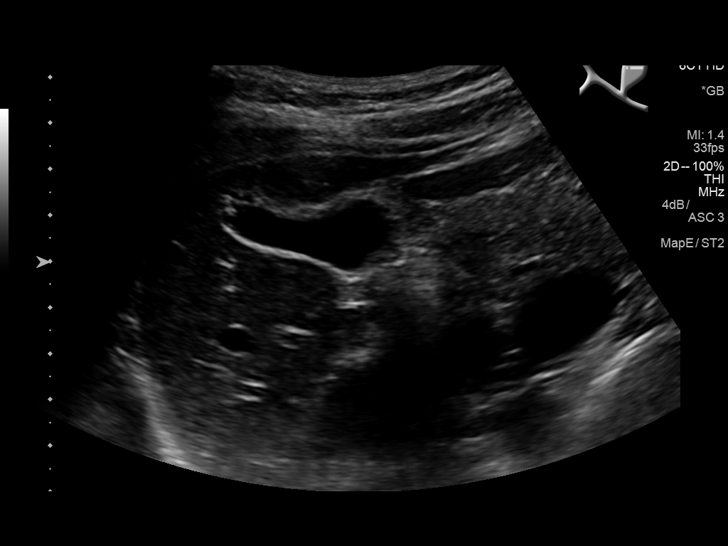
[im 8/45]
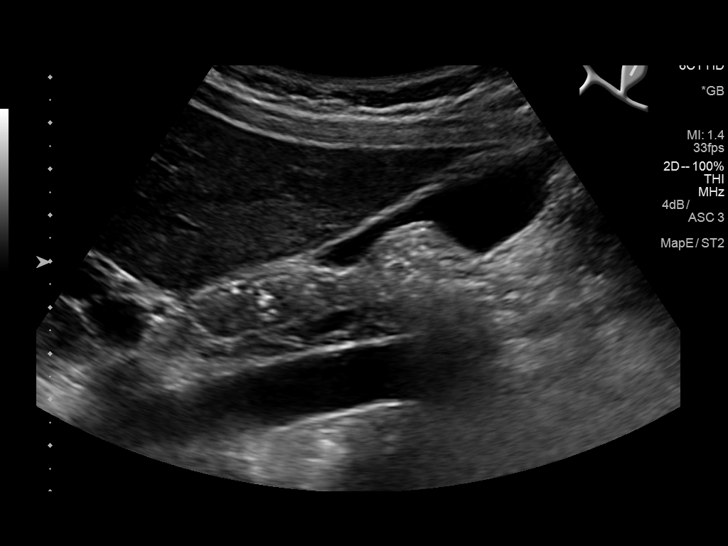
[im 12/45]
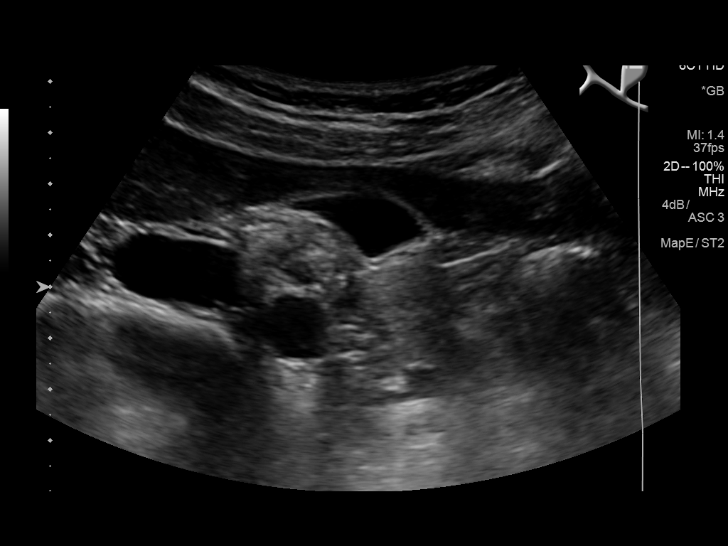
[im 15/45]
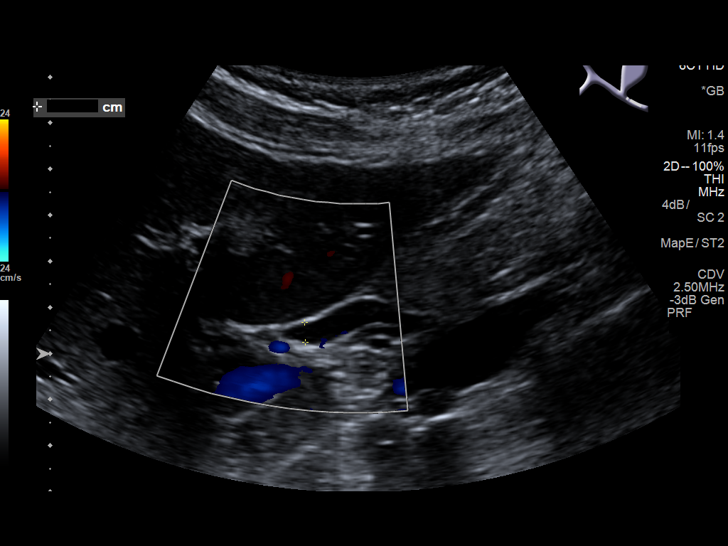
[im 17/45]
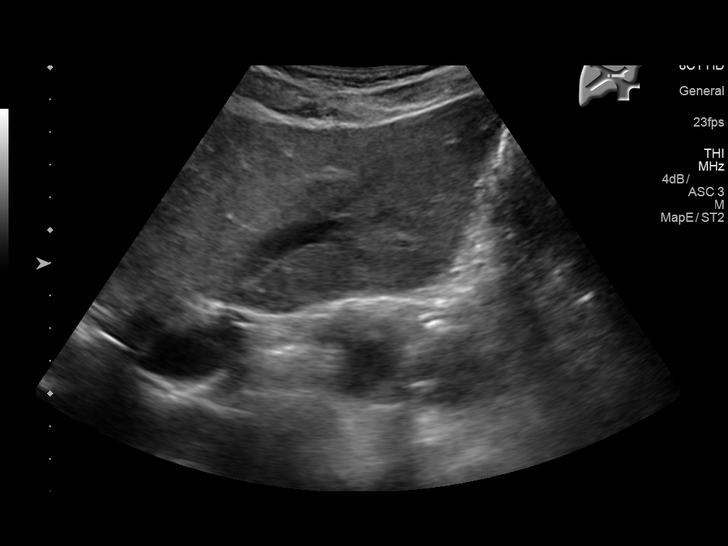
[im 21/45]
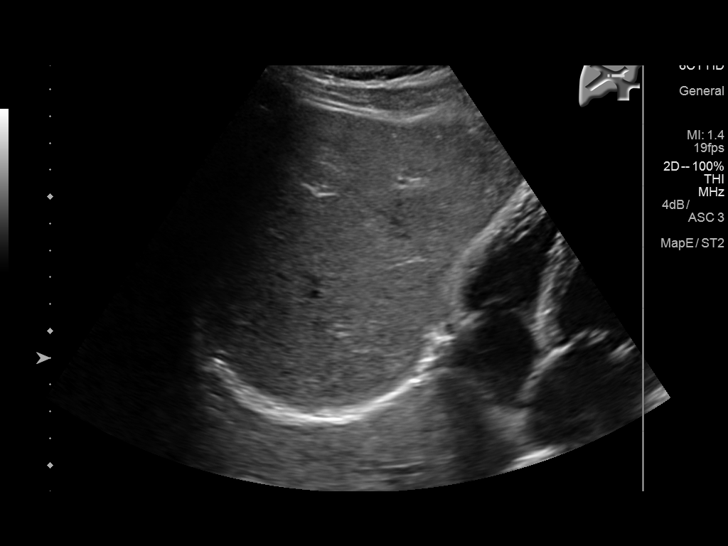
[im 24/45]
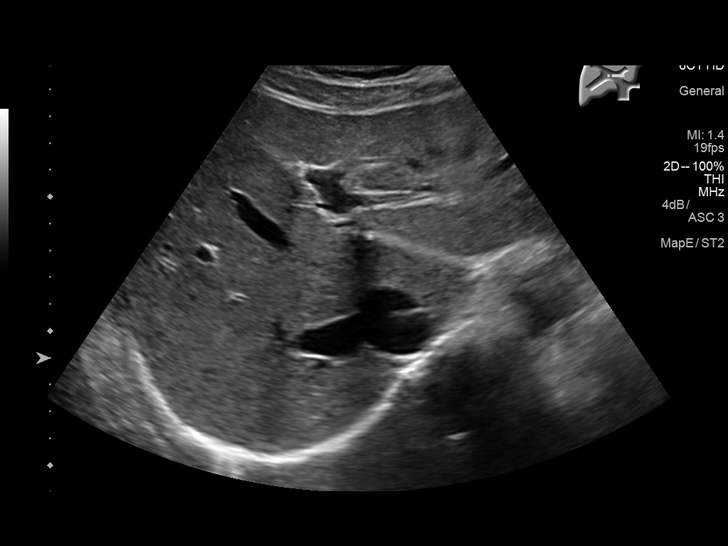
[im 28/45]
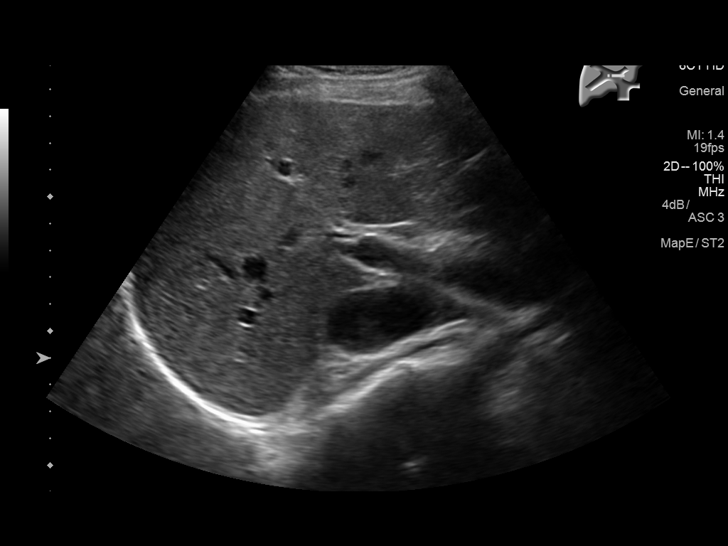
[im 30/45]
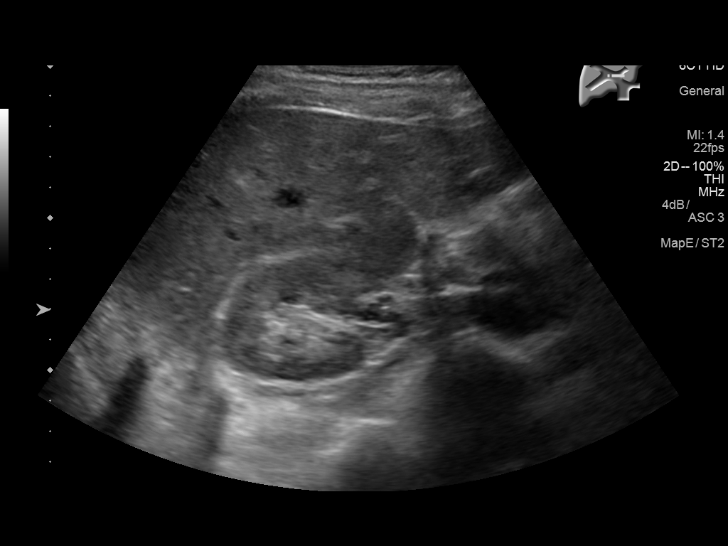
[im 34/45]
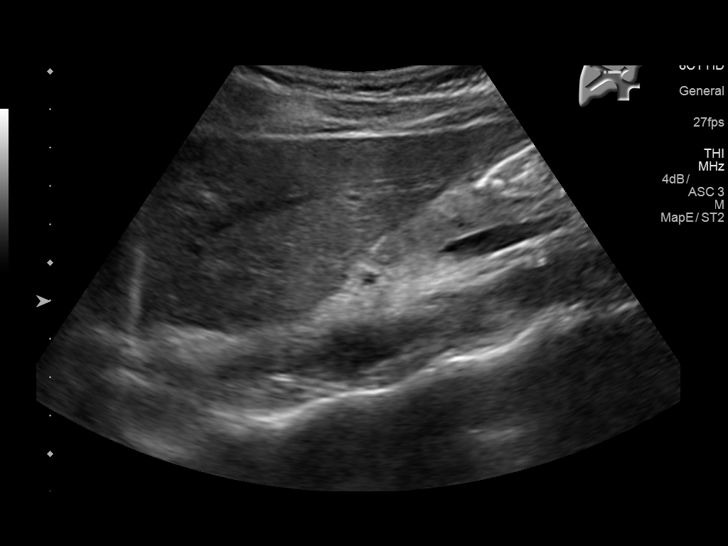
[im 37/45]
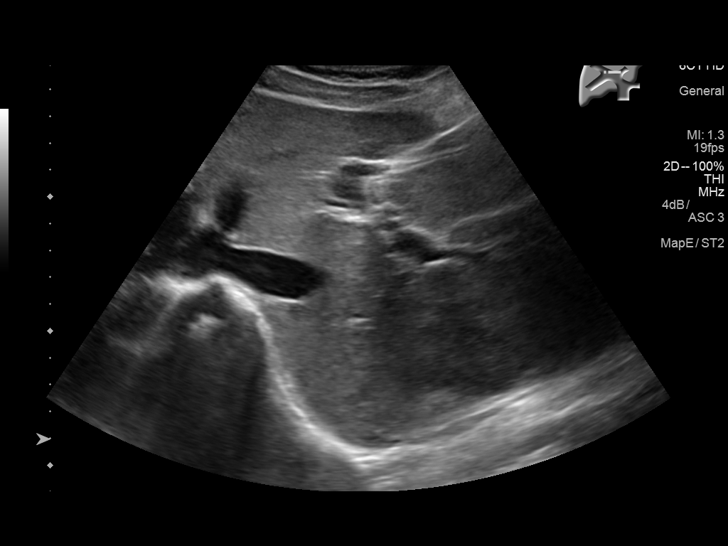
[im 41/45]
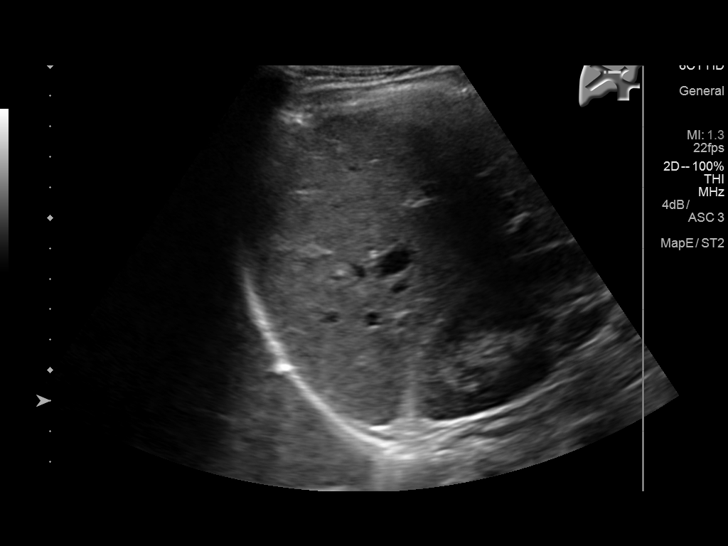
[im 45/45]
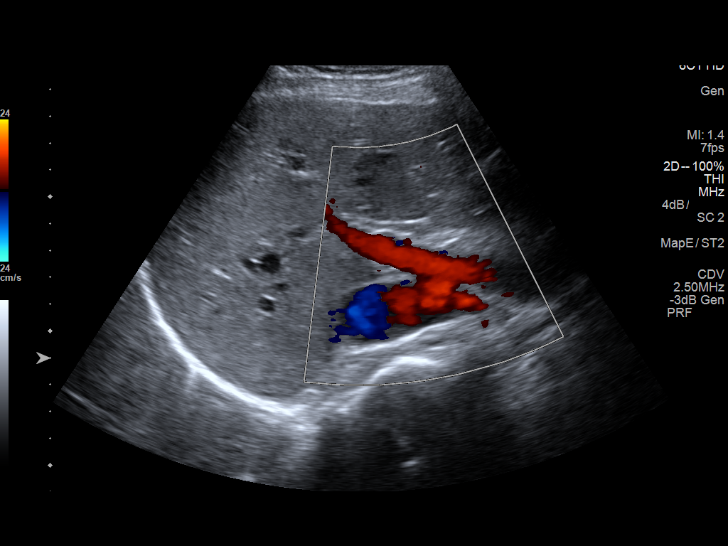

[14 of 25 positions shown; findings below may reference images not displayed]

FINDINGS: Gallbladder:

No gallstones or wall thickening visualized. No sonographic Murphy
sign noted by sonographer.

Common bile duct:

Diameter: 4 mm

Liver:

No focal lesion identified. Within normal limits in parenchymal
echogenicity.
IMPRESSION: No cholelithiasis or sonographic evidence for acute cholecystitis.

## 2017-11-30 ENCOUNTER — Emergency Department (HOSPITAL_BASED_OUTPATIENT_CLINIC_OR_DEPARTMENT_OTHER)
Admission: EM | Admit: 2017-11-30 | Discharge: 2017-11-30 | Disposition: A | Discharge status: Home / Self Care | Attending: Emergency Medicine | Admitting: Emergency Medicine

## 2017-11-30 ENCOUNTER — Encounter (HOSPITAL_BASED_OUTPATIENT_CLINIC_OR_DEPARTMENT_OTHER): Payer: Self-pay | Admitting: Emergency Medicine

## 2017-11-30 ENCOUNTER — Other Ambulatory Visit: Payer: Self-pay

## 2017-11-30 ENCOUNTER — Ambulatory Visit (HOSPITAL_BASED_OUTPATIENT_CLINIC_OR_DEPARTMENT_OTHER)
Admission: RE | Admit: 2017-11-30 | Discharge: 2017-11-30 | Disposition: A | Discharge status: Home / Self Care | Payer: Self-pay | Source: Ambulatory Visit | Attending: Emergency Medicine | Admitting: Emergency Medicine

## 2017-11-30 DIAGNOSIS — S4991XA Unspecified injury of right shoulder and upper arm, initial encounter: Secondary | ICD-10-CM | POA: Insufficient documentation

## 2017-11-30 DIAGNOSIS — F17210 Nicotine dependence, cigarettes, uncomplicated: Secondary | ICD-10-CM | POA: Insufficient documentation

## 2017-11-30 DIAGNOSIS — X58XXXA Exposure to other specified factors, initial encounter: Secondary | ICD-10-CM | POA: Insufficient documentation

## 2017-11-30 DIAGNOSIS — T1490XA Injury, unspecified, initial encounter: Secondary | ICD-10-CM | POA: Insufficient documentation

## 2017-11-30 DIAGNOSIS — M25511 Pain in right shoulder: Secondary | ICD-10-CM | POA: Diagnosis present

## 2017-11-30 DIAGNOSIS — M25519 Pain in unspecified shoulder: Secondary | ICD-10-CM

## 2017-11-30 DIAGNOSIS — Z79899 Other long term (current) drug therapy: Secondary | ICD-10-CM | POA: Insufficient documentation

## 2017-11-30 MED ORDER — ACETAMINOPHEN 500 MG PO TABS: 1000.0000 mg | ORAL_TABLET | Freq: Once | ORAL | Status: DC

## 2017-11-30 MED ORDER — IBUPROFEN 800 MG PO TABS
800.0000 mg | ORAL_TABLET | Freq: Once | ORAL | Status: AC
  Administered 2017-11-30: 800 mg via ORAL

## 2017-11-30 NOTE — ED Notes (Signed)
Patient discharged with Rx for ibuprofen and information for workers comp followup. Patient verbalized understanding.

## 2017-11-30 NOTE — ED Notes (Signed)
Patient states she took 2 tylenol while at work; states no relief.

## 2017-11-30 NOTE — ED Triage Notes (Signed)
Patient co right arm and shoulder pain after a box fell onto her at work; nad noted; no obvious deformity noted.

## 2017-11-30 NOTE — ED Provider Notes (Signed)
[facility] EMERGENCY DEPARTMENT Provider Note   CSN: [id number] Arrival date & time: 11/30/17  0150     History   Chief Complaint Chief Complaint  Patient presents with  . Shoulder Pain    HPI Elizabeth Wong is a 37 y.o. female.  The history is provided by the patient.  Shoulder Pain   This is a new problem. The current episode started 1 to 2 hours ago. The problem occurs constantly. The problem has not changed since onset.The pain is present in the right shoulder and right arm. The quality of the pain is described as aching. The pain is at a severity of 10/10. The pain is severe. Pertinent negatives include no numbness, full range of motion, no stiffness, no tingling and no itching. The symptoms are aggravated by activity. She has tried nothing for the symptoms. The treatment provided no relief. There has been a history of trauma (box fell on shoulder at work). Family history is significant for no rheumatoid arthritis.    Past Medical History:  Diagnosis Date  . Back pain   . Chronic pain    Pain Clinic Patient   . Headache   . Neck pain     Patient Active Problem List   Diagnosis Date Noted  . Chronic pain   . Disturbance of skin sensation 11/20/2013  . Numbness 11/20/2013  . Pain in limb 11/20/2013  . Neuropathic pain 11/14/2013    History reviewed. No pertinent surgical history.   OB History   None      Home Medications    Prior to Admission medications   Medication Sig Start Date End Date Taking? Authorizing Provider  acetaminophen (TYLENOL) 500 MG tablet Take 1,000 mg by mouth every 6 (six) hours as needed for headache.     [provider]  cephALEXin (KEFLEX) 500 MG capsule Take 1 capsule (500 mg total) by mouth 4 (four) times daily. 10/12/15   Raeford RazorKohut, Stephen, MD  ibuprofen (ADVIL,MOTRIN) 200 MG tablet Take 600-800 mg by mouth every 6 (six) hours as needed for headache or moderate pain.    [provider]  naproxen sodium  (ANAPROX) 220 MG tablet Take 440 mg by mouth 2 (two) times daily as needed (headaches).    [provider]  oxyCODONE-acetaminophen (PERCOCET/ROXICET) 5-325 MG tablet Take 1 tablet by mouth every 4 (four) hours as needed for severe pain. 10/12/15   Raeford RazorKohut, Stephen, MD    Family History Family History  Problem Relation Age of Onset  . Cancer Father        Colon    Social History Social History   Tobacco Use  . Smoking status: Current Every Day Smoker  . Smokeless tobacco: Current User  Substance Use Topics  . Alcohol use: Yes  . Drug use: No     Allergies   Patient has no known allergies.   Review of Systems Review of Systems  Constitutional: Negative for fever.  Respiratory: Negative for shortness of breath.   Cardiovascular: Negative for chest pain.  Musculoskeletal: Positive for arthralgias. Negative for back pain, myalgias, neck pain and stiffness.  Skin: Negative for itching.  Neurological: Negative for tingling, weakness and numbness.  All other systems reviewed and are negative.    Physical Exam Updated Vital Signs There were no vitals taken for this visit.  Physical Exam  Constitutional: She is oriented to person, place, and time. She appears well-developed and well-nourished. No distress.  HENT:  Head: Normocephalic and atraumatic.  Mouth/Throat: No  oropharyngeal exudate.  Eyes: Pupils are equal, round, and reactive to light. Conjunctivae are normal.  Neck: Normal range of motion. Neck supple.  Cardiovascular: Normal rate, regular rhythm, normal heart sounds and intact distal pulses.  Pulmonary/Chest: Effort normal and breath sounds normal. No stridor. She has no wheezes. She has no rales.  Abdominal: Soft. Bowel sounds are normal. She exhibits no mass. There is no tenderness. There is no rebound and no guarding.  Musculoskeletal: Normal range of motion.  Neurological: She is alert and oriented to person, place, and time. She displays normal  reflexes.  Skin: Skin is warm and dry. Capillary refill takes less than 2 seconds.  Psychiatric: Her affect is angry. She is aggressive.  Nursing note and vitals reviewed.    ED Treatments / Results  Labs (all labs ordered are listed, but only abnormal results are displayed) Labs Reviewed - No data to display  EKG None  Radiology No results found.  Procedures Procedures (including critical care time)  Medications Ordered in ED   Final Clinical Impressions(s) / ED Diagnoses   Workman's comp and patient would like to be written out of work for a week.  When I told the patient this was not permissible, she then stated "this place is a joke." I should have gone to a real hospital.  She wanted something more for pain but this is not advisable nor indicated.    Return for fevers >100.4 unrelieved by medication, shortness of breath, intractable vomiting, or diarrhea, Inability to tolerate liquids or food, cough, altered mental status or any concerns. No signs of systemic illness or infection. The patient is nontoxic-appearing on exam and vital signs are within normal limits.   I have reviewed the triage vital signs and the nursing notes. Pertinent labs &imaging results that were available during my care of the patient were reviewed by me and considered in my medical decision making (see chart for details).  After history, exam, and medical workup I feel the patient has been appropriately medically screened and is safe for discharge home. Pertinent diagnoses were discussed with the patient. Patient was given return precautions.   Yurem Viner, MD 11/30/17 [phone]

## 2018-03-21 DIAGNOSIS — K047 Periapical abscess without sinus: Secondary | ICD-10-CM | POA: Diagnosis not present

## 2018-03-21 DIAGNOSIS — F17210 Nicotine dependence, cigarettes, uncomplicated: Secondary | ICD-10-CM | POA: Diagnosis not present

## 2018-03-21 DIAGNOSIS — K0889 Other specified disorders of teeth and supporting structures: Secondary | ICD-10-CM | POA: Diagnosis not present

## 2018-03-21 DIAGNOSIS — R220 Localized swelling, mass and lump, head: Secondary | ICD-10-CM | POA: Diagnosis not present

## 2018-08-02 DIAGNOSIS — C439 Malignant melanoma of skin, unspecified: Secondary | ICD-10-CM | POA: Diagnosis not present

## 2018-09-26 DIAGNOSIS — F17210 Nicotine dependence, cigarettes, uncomplicated: Secondary | ICD-10-CM | POA: Diagnosis not present

## 2018-09-26 DIAGNOSIS — K5641 Fecal impaction: Secondary | ICD-10-CM | POA: Diagnosis not present

## 2018-09-26 DIAGNOSIS — K5900 Constipation, unspecified: Secondary | ICD-10-CM | POA: Diagnosis not present

## 2018-09-26 DIAGNOSIS — R1084 Generalized abdominal pain: Secondary | ICD-10-CM | POA: Diagnosis not present

## 2018-09-26 DIAGNOSIS — N200 Calculus of kidney: Secondary | ICD-10-CM | POA: Diagnosis not present

## 2018-09-26 DIAGNOSIS — R1031 Right lower quadrant pain: Secondary | ICD-10-CM | POA: Diagnosis not present

## 2018-10-08 DIAGNOSIS — N390 Urinary tract infection, site not specified: Secondary | ICD-10-CM | POA: Diagnosis not present

## 2018-10-08 DIAGNOSIS — F17210 Nicotine dependence, cigarettes, uncomplicated: Secondary | ICD-10-CM | POA: Diagnosis not present

## 2018-10-08 DIAGNOSIS — K5900 Constipation, unspecified: Secondary | ICD-10-CM | POA: Diagnosis not present

## 2018-10-08 DIAGNOSIS — R109 Unspecified abdominal pain: Secondary | ICD-10-CM | POA: Diagnosis not present

## 2018-10-08 DIAGNOSIS — Z79899 Other long term (current) drug therapy: Secondary | ICD-10-CM | POA: Diagnosis not present

## 2018-10-30 DIAGNOSIS — Z20828 Contact with and (suspected) exposure to other viral communicable diseases: Secondary | ICD-10-CM | POA: Diagnosis not present

## 2019-01-29 DIAGNOSIS — M25511 Pain in right shoulder: Secondary | ICD-10-CM | POA: Diagnosis not present

## 2020-02-21 ENCOUNTER — Telehealth: Payer: Self-pay

## 2020-02-21 ENCOUNTER — Ambulatory Visit
Admission: EM | Admit: 2020-02-21 | Discharge: 2020-02-21 | Disposition: A | Discharge status: Home / Self Care | Payer: Self-pay | Attending: Family Medicine | Admitting: Family Medicine

## 2020-02-21 DIAGNOSIS — T300 Burn of unspecified body region, unspecified degree: Secondary | ICD-10-CM

## 2020-02-21 MED ORDER — SILVER SULFADIAZINE 1 % EX CREA: 1.0000 "application " | TOPICAL_CREAM | Freq: Two times a day (BID) | CUTANEOUS | 0 refills | Status: DC

## 2020-02-21 MED ORDER — DOXYCYCLINE HYCLATE 100 MG PO CAPS: 100.0000 mg | ORAL_CAPSULE | Freq: Two times a day (BID) | ORAL | 0 refills | Status: AC

## 2020-02-21 MED ORDER — DOXYCYCLINE HYCLATE 100 MG PO CAPS: 100.0000 mg | ORAL_CAPSULE | Freq: Two times a day (BID) | ORAL | 0 refills | Status: DC
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
## 2020-02-24 ENCOUNTER — Ambulatory Visit
Admission: EM | Admit: 2020-02-24 | Discharge: 2020-02-24 | Disposition: A | Discharge status: Home / Self Care | Payer: Medicaid Other

## 2020-02-24 ENCOUNTER — Encounter: Payer: Self-pay | Admitting: Emergency Medicine

## 2020-02-24 DIAGNOSIS — T22212A Burn of second degree of left forearm, initial encounter: Secondary | ICD-10-CM | POA: Diagnosis not present

## 2020-02-24 NOTE — Discharge Instructions (Addendum)
Please use Tylenol [phone] mg every 4-6 hours, may supplement with Ibuprofen 600 mg every 8 hours May apply aloe vera for further relief/healing May apply wet gauze to keep cool Do not apply ice

## 2020-02-24 NOTE — ED Notes (Signed)
Non adherent dressing applied to right hand with 4x4 gauze on top. Extra supplies given for home dressing changes.
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
## 2020-07-10 ENCOUNTER — Other Ambulatory Visit: Payer: Self-pay | Admitting: Geriatric Medicine

## 2020-07-10 ENCOUNTER — Other Ambulatory Visit (HOSPITAL_COMMUNITY): Payer: Self-pay | Admitting: Geriatric Medicine

## 2020-07-10 DIAGNOSIS — R1011 Right upper quadrant pain: Secondary | ICD-10-CM

## 2020-07-31 ENCOUNTER — Ambulatory Visit (HOSPITAL_COMMUNITY)
Admission: RE | Admit: 2020-07-31 | Discharge: 2020-07-31 | Disposition: A | Discharge status: Home / Self Care | Payer: Federal, State, Local not specified - PPO | Source: Ambulatory Visit | Attending: Geriatric Medicine | Admitting: Geriatric Medicine

## 2020-07-31 ENCOUNTER — Other Ambulatory Visit: Payer: Self-pay

## 2020-07-31 DIAGNOSIS — R1011 Right upper quadrant pain: Secondary | ICD-10-CM | POA: Insufficient documentation

## 2020-07-31 MED ORDER — TECHNETIUM TC 99M MEBROFENIN IV KIT
5.5000 | PACK | Freq: Once | INTRAVENOUS | Status: AC
  Administered 2020-07-31: 5.5 via INTRAVENOUS

## 2021-07-21 ENCOUNTER — Ambulatory Visit: Payer: Self-pay | Admitting: Obstetrics and Gynecology

## 2021-08-06 ENCOUNTER — Ambulatory Visit: Payer: Federal, State, Local not specified - PPO | Admitting: Obstetrics and Gynecology

## 2021-08-06 ENCOUNTER — Encounter: Payer: Self-pay | Admitting: Obstetrics and Gynecology

## 2021-08-06 ENCOUNTER — Encounter: Payer: Self-pay | Admitting: Anesthesiology

## 2021-08-06 VITALS — BP 118/72 | HR 78 | Ht 66.0 in | Wt 146.0 lb

## 2021-08-06 DIAGNOSIS — N920 Excessive and frequent menstruation with regular cycle: Secondary | ICD-10-CM

## 2021-08-06 DIAGNOSIS — Z01419 Encounter for gynecological examination (general) (routine) without abnormal findings: Secondary | ICD-10-CM

## 2021-08-06 DIAGNOSIS — N946 Dysmenorrhea, unspecified: Secondary | ICD-10-CM

## 2021-08-06 NOTE — Progress Notes (Signed)
41 y.o. G3P3 Single Caucasian female here for annual exam.    September, 2021, had a really heavy cycle.  Then skipped a couple of months and would have cycles last for weeks a times.  For the following year, she had irregular cycles.   For the last 6 months, has cycles every 3 weeks.  Has clotting and cramping.  Can use pad and tampon and changes every 2 - 4 hours.  No prior US.   She can have a sharp right sided pain and a cramping that comes and goes over the last month.  Had STD screening with her Romelle Starcher PCP within the last 6 months.  Declines future pregnancies.   Had injury at work and has chronic pain.  She goes to the pain clinic at [facility].  She takes hydrocodone 10/325 6 times a day and Butrans patch once a week.  She also takes muscle relaxer once a week.   States she has fatty liver.  She works for the [employer].   Mother had endometriosis.   PCP:   Andy Gauss.   Patient's last menstrual period was 07/23/2021.     Period Cycle (Days):  (every 2-3 weeks) Period Duration (Days): 7 Period Pattern: (!) Irregular Menstrual Flow: Heavy Dysmenorrhea: (!) Severe Dysmenorrhea Symptoms: Cramping     Sexually active: Yes.    The current method of family planning is condoms most of the time.    Exercising: No.   Smoker:  yes  Health Maintenance: Pap:  06-2021 Pt.states normal at [facility]  Uncertain if she had HR HPV testing.  History of abnormal Pap:  no MMG:  Never.  [facility].  Colonoscopy:  2014 normal BMD:   N/A  Result   TDaP:  UTD Gardasil:   no HIV:NR Hep C:Neg Screening Labs:  Hb today:PCP, Urine today:    reports that she has been smoking. She uses smokeless tobacco. She reports current alcohol use. She reports that she does not use drugs.  Past Medical History:  Diagnosis Date   Back pain    Chronic pain    Pain Clinic Patient    Headache    Neck pain     Past Surgical History:  Procedure  Laterality Date   CHOLECYSTECTOMY N/A    SHOULDER SURGERY N/A     Current Outpatient Medications  Medication Sig Dispense Refill   acetaminophen (TYLENOL) 500 MG tablet Take 1,000 mg by mouth every 6 (six) hours as needed for headache.      buprenorphine (BUTRANS) 5 MCG/HR PTWK 1 patch once a week.     HYDROcodone-acetaminophen (NORCO) 10-325 MG tablet Take 1 tablet by mouth 2 (two) times daily as needed.     tiZANidine (ZANAFLEX) 4 MG tablet Take 4 mg by mouth 3 (three) times daily.     No current facility-administered medications for this visit.    Family History  Problem Relation Age of Onset   Endometriosis Mother    Cancer Father        Colon   Melanoma Son     Review of Systems  Genitourinary:        Heavy,Irregular periods---Pain RLQ   Exam:   BP 118/72 (BP Location: Right Arm, Patient Position: Sitting, Cuff Size: Normal)   Pulse 78   Ht 5\' 6"  (1.676 m)   Wt 146 lb (66.2 kg)   LMP 07/23/2021   SpO2 98%   BMI 23.57 kg/m  General appearance: alert, cooperative and appears stated age Head: normocephalic, without obvious abnormality, atraumatic Neck: no adenopathy, supple, symmetrical, trachea midline and thyroid normal to inspection and palpation Lungs: clear to auscultation bilaterally Heart: regular rate and rhythm Abdomen: soft, non-tender; no masses, no organomegaly Extremities: extremities normal, atraumatic, no cyanosis or edema Skin: skin color, texture, turgor normal. No rashes or lesions Lymph nodes: cervical, supraclavicular, and axillary nodes normal. Neurologic: grossly normal  Pelvic: External genitalia:  no lesions              No abnormal inguinal nodes palpated.              Urethra:  normal appearing urethra with no masses, tenderness or lesions              Bartholins and Skenes: normal                 Vagina: normal appearing vagina with normal color and discharge, no lesions              Cervix: no lesions              Pap taken:  no Bimanual Exam:  Uterus:  normal size, contour, position, consistency, mobility, non-tender              Adnexa: no mass, fullness, tenderness              Rectal exam: yes.  Confirms.              Anus:  normal sphincter tone, no lesions  Chaperone was present for exam:  Kim  Assessment:    Menorrhagia with regular menses.  Dysmenorrhea. RLQ pain.  Smoker.  Hx chronic right shoulder pain.  Attends pain clinic at [facility]   Plan: Mammogram screening discussed.  She will schedule.  We discussed heavy and painful menses and potential etiologies:  fibroids, hyperplasia, endometriosis, precancer.  She will return for pelvic US and possible endometrial biopsy.  Will check TSH and CBC today.  Will get copy of her pap.  I did introduce ideas of treatment:  Mirena IUD, POP, surgical care.     After visit summary provided.   40 min  total time was spent for this patient encounter, including preparation, face-to-face counseling with the patient, coordination of care, and documentation of the encounter.

## 2021-08-07 LAB — CBC
HCT: 46.8 % — ABNORMAL HIGH (ref 35.0–45.0)
Hemoglobin: 15.5 g/dL (ref 11.7–15.5)
MCH: 31.1 pg (ref 27.0–33.0)
MCHC: 33.1 g/dL (ref 32.0–36.0)
MCV: 94 fL (ref 80.0–100.0)
MPV: 10.5 fL (ref 7.5–12.5)
Platelets: 286 10*3/uL (ref 140–400)
RBC: 4.98 10*6/uL (ref 3.80–5.10)
RDW: 11.8 % (ref 11.0–15.0)
WBC: 10.8 10*3/uL (ref 3.8–10.8)

## 2021-08-07 LAB — TSH: TSH: 2.1 mIU/L

## 2021-09-16 NOTE — Progress Notes (Deleted)
GYNECOLOGY  VISIT   HPI: 41 y.o.   Single  Caucasian  female   G3P3 with No LMP recorded.   here for pelvic ultrasound and possible endometrial biopsy.   GYNECOLOGIC HISTORY: No LMP recorded. Contraception:  condoms most of the time Menopausal hormone therapy:  none Last mammogram:  ***never Last pap smear:  04-28-21 Neg:Neg HR HPV        OB History     Gravida  3   Para  3   Term      Preterm      AB      Living  3      SAB      IAB      Ectopic      Multiple      Live Births                 Patient Active Problem List   Diagnosis Date Noted   Chronic pain    Disturbance of skin sensation 11/20/2013   Numbness 11/20/2013   Pain in limb 11/20/2013   Neuropathic pain 11/14/2013    Past Medical History:  Diagnosis Date   Back pain    Chronic pain    Pain Clinic Patient    Headache    Neck pain     Past Surgical History:  Procedure Laterality Date   CHOLECYSTECTOMY N/A    SHOULDER SURGERY N/A     Current Outpatient Medications  Medication Sig Dispense Refill   acetaminophen (TYLENOL) 500 MG tablet Take 1,000 mg by mouth every 6 (six) hours as needed for headache.      buprenorphine (BUTRANS) 5 MCG/HR PTWK 1 patch once a week.     HYDROcodone-acetaminophen (NORCO) 10-325 MG tablet Take 1 tablet by mouth 2 (two) times daily as needed.     tiZANidine (ZANAFLEX) 4 MG tablet Take 4 mg by mouth 3 (three) times daily.     No current facility-administered medications for this visit.     ALLERGIES: Patient has no known allergies.  Family History  Problem Relation Age of Onset   Endometriosis Mother    Cancer Father        Colon   Melanoma Son     Social History   Socioeconomic History   Marital status: Single    Spouse name: Not on file   Number of children: 3   Years of education: college   Highest education level: Not on file  Occupational History   Occupation: N/A    Employer: [employer]  Tobacco Use   Smoking status:  Every Day   Smokeless tobacco: Current  Vaping Use   Vaping Use: Never used  Substance and Sexual Activity   Alcohol use: Yes    Comment: Occas   Drug use: No   Sexual activity: Yes    Birth control/protection: Condom  Other Topics Concern   Not on file  Social History Narrative   Not on file   Social Determinants of Health   Financial Resource Strain: Not on file  Food Insecurity: Not on file  Transportation Needs: Not on file  Physical Activity: Not on file  Stress: Not on file  Social Connections: Not on file  Intimate Partner Violence: Not on file    Review of Systems  PHYSICAL EXAMINATION:    There were no vitals taken for this visit.    General appearance: alert, cooperative and appears stated age Head: Normocephalic, without obvious abnormality, atraumatic Neck: no adenopathy, supple,  symmetrical, trachea midline and thyroid normal to inspection and palpation Lungs: clear to auscultation bilaterally Breasts: normal appearance, no masses or tenderness, No nipple retraction or dimpling, No nipple discharge or bleeding, No axillary or supraclavicular adenopathy Heart: regular rate and rhythm Abdomen: soft, non-tender, no masses,  no organomegaly Extremities: extremities normal, atraumatic, no cyanosis or edema Skin: Skin color, texture, turgor normal. No rashes or lesions Lymph nodes: Cervical, supraclavicular, and axillary nodes normal. No abnormal inguinal nodes palpated Neurologic: Grossly normal  Pelvic: External genitalia:  no lesions              Urethra:  normal appearing urethra with no masses, tenderness or lesions              Bartholins and Skenes: normal                 Vagina: normal appearing vagina with normal color and discharge, no lesions              Cervix: no lesions                Bimanual Exam:  Uterus:  normal size, contour, position, consistency, mobility, non-tender              Adnexa: no mass, fullness, tenderness              Rectal  exam: {yes no:314532}.  Confirms.              Anus:  normal sphincter tone, no lesions  Chaperone was present for exam:  ***  ASSESSMENT     PLAN     An After Visit Summary was printed and given to the patient.  ______ minutes face to face time of which over 50% was spent in counseling.

## 2021-09-23 ENCOUNTER — Other Ambulatory Visit: Payer: Federal, State, Local not specified - PPO

## 2021-09-23 ENCOUNTER — Other Ambulatory Visit: Payer: Federal, State, Local not specified - PPO | Admitting: Obstetrics and Gynecology

## 2021-11-09 NOTE — Progress Notes (Deleted)
GYNECOLOGY  VISIT   HPI: 41 y.o.   Single  {Race/ethnicity:17218}  female   G3P3 with No LMP recorded.   here for pelvic ultrasound and possible EMB.    GYNECOLOGIC HISTORY: No LMP recorded. Contraception:  condoms most of the time Menopausal hormone therapy:  n/a Last mammogram:  ***NEVER Last pap smear:    06-2021 Pt.states normal at [facility].  Uncertain if she had HR HPV testing.         OB History     Gravida  3   Para  3   Term      Preterm      AB      Living  3      SAB      IAB      Ectopic      Multiple      Live Births                 Patient Active Problem List   Diagnosis Date Noted   Chronic pain    Disturbance of skin sensation 11/20/2013   Numbness 11/20/2013   Pain in limb 11/20/2013   Neuropathic pain 11/14/2013    Past Medical History:  Diagnosis Date   Back pain    Chronic pain    Pain Clinic Patient    Headache    Neck pain     Past Surgical History:  Procedure Laterality Date   CHOLECYSTECTOMY N/A    SHOULDER SURGERY N/A     Current Outpatient Medications  Medication Sig Dispense Refill   acetaminophen (TYLENOL) 500 MG tablet Take 1,000 mg by mouth every 6 (six) hours as needed for headache.      buprenorphine (BUTRANS) 5 MCG/HR PTWK 1 patch once a week.     HYDROcodone-acetaminophen (NORCO) 10-325 MG tablet Take 1 tablet by mouth 2 (two) times daily as needed.     tiZANidine (ZANAFLEX) 4 MG tablet Take 4 mg by mouth 3 (three) times daily.     No current facility-administered medications for this visit.     ALLERGIES: Patient has no known allergies.  Family History  Problem Relation Age of Onset   Endometriosis Mother    Cancer Father        Colon   Melanoma Son     Social History   Socioeconomic History   Marital status: Single    Spouse name: Not on file   Number of children: 3   Years of education: college   Highest education level: Not on file  Occupational History   Occupation: N/A    Employer:  [employer]  Tobacco Use   Smoking status: Every Day   Smokeless tobacco: Current  Vaping Use   Vaping Use: Never used  Substance and Sexual Activity   Alcohol use: Yes    Comment: Occas   Drug use: No   Sexual activity: Yes    Birth control/protection: Condom  Other Topics Concern   Not on file  Social History Narrative   Not on file   Social Determinants of Health   Financial Resource Strain: Not on file  Food Insecurity: Not on file  Transportation Needs: Not on file  Physical Activity: Not on file  Stress: Not on file  Social Connections: Not on file  Intimate Partner Violence: Not on file    Review of Systems  PHYSICAL EXAMINATION:    There were no vitals taken for this visit.    General appearance: alert, cooperative and appears  stated age Head: Normocephalic, without obvious abnormality, atraumatic Neck: no adenopathy, supple, symmetrical, trachea midline and thyroid normal to inspection and palpation Lungs: clear to auscultation bilaterally Breasts: normal appearance, no masses or tenderness, No nipple retraction or dimpling, No nipple discharge or bleeding, No axillary or supraclavicular adenopathy Heart: regular rate and rhythm Abdomen: soft, non-tender, no masses,  no organomegaly Extremities: extremities normal, atraumatic, no cyanosis or edema Skin: Skin color, texture, turgor normal. No rashes or lesions Lymph nodes: Cervical, supraclavicular, and axillary nodes normal. No abnormal inguinal nodes palpated Neurologic: Grossly normal  Pelvic: External genitalia:  no lesions              Urethra:  normal appearing urethra with no masses, tenderness or lesions              Bartholins and Skenes: normal                 Vagina: normal appearing vagina with normal color and discharge, no lesions              Cervix: no lesions                Bimanual Exam:  Uterus:  normal size, contour, position, consistency, mobility, non-tender              Adnexa:  no mass, fullness, tenderness              Rectal exam: {yes no:314532}.  Confirms.              Anus:  normal sphincter tone, no lesions  Chaperone was present for exam:  ***  ASSESSMENT     PLAN     An After Visit Summary was printed and given to the patient.  ______ minutes face to face time of which over 50% was spent in counseling.

## 2021-11-10 ENCOUNTER — Other Ambulatory Visit: Payer: Self-pay

## 2021-11-10 DIAGNOSIS — N920 Excessive and frequent menstruation with regular cycle: Secondary | ICD-10-CM

## 2021-11-10 DIAGNOSIS — N946 Dysmenorrhea, unspecified: Secondary | ICD-10-CM

## 2021-11-11 ENCOUNTER — Other Ambulatory Visit: Payer: Federal, State, Local not specified - PPO

## 2021-11-11 ENCOUNTER — Other Ambulatory Visit: Payer: Federal, State, Local not specified - PPO | Admitting: Obstetrics and Gynecology

## 2023-06-01 ENCOUNTER — Other Ambulatory Visit: Payer: Self-pay

## 2023-06-01 ENCOUNTER — Emergency Department (HOSPITAL_COMMUNITY)
Admission: EM | Admit: 2023-06-01 | Discharge: 2023-06-01 | Discharge status: Against Medical Advice | Attending: Emergency Medicine | Admitting: Emergency Medicine

## 2023-06-01 ENCOUNTER — Encounter (HOSPITAL_COMMUNITY): Payer: Self-pay | Admitting: Emergency Medicine

## 2023-06-01 DIAGNOSIS — Z5329 Procedure and treatment not carried out because of patient's decision for other reasons: Secondary | ICD-10-CM | POA: Diagnosis not present

## 2023-06-01 DIAGNOSIS — K625 Hemorrhage of anus and rectum: Secondary | ICD-10-CM | POA: Insufficient documentation

## 2023-06-01 DIAGNOSIS — R1031 Right lower quadrant pain: Secondary | ICD-10-CM | POA: Diagnosis present

## 2023-06-01 LAB — CBC
HCT: 43.5 % (ref 36.0–46.0)
Hemoglobin: 14.2 g/dL (ref 12.0–15.0)
MCH: 31.3 pg (ref 26.0–34.0)
MCHC: 32.6 g/dL (ref 30.0–36.0)
MCV: 96 fL (ref 80.0–100.0)
Platelets: 287 10*3/uL (ref 150–400)
RBC: 4.53 MIL/uL (ref 3.87–5.11)
RDW: 12.2 % (ref 11.5–15.5)
WBC: 7.1 10*3/uL (ref 4.0–10.5)
nRBC: 0 % (ref 0.0–0.2)

## 2023-06-01 LAB — COMPREHENSIVE METABOLIC PANEL
ALT: 13 U/L (ref 0–44)
AST: 21 U/L (ref 15–41)
Albumin: 3.5 g/dL (ref 3.5–5.0)
Alkaline Phosphatase: 44 U/L (ref 38–126)
Anion gap: 10 (ref 5–15)
BUN: 11 mg/dL (ref 6–20)
CO2: 22 mmol/L (ref 22–32)
Calcium: 9 mg/dL (ref 8.9–10.3)
Chloride: 107 mmol/L (ref 98–111)
Creatinine, Ser: 0.78 mg/dL (ref 0.44–1.00)
GFR, Estimated: >60 mL/min (ref >60)
Glucose, Bld: 155 mg/dL — ABNORMAL HIGH (ref 70–99)
Potassium: 3.7 mmol/L (ref 3.5–5.1)
Sodium: 139 mmol/L (ref 135–145)
Total Bilirubin: 0.6 mg/dL (ref 0.0–1.2)
Total Protein: 7 g/dL (ref 6.5–8.1)

## 2023-06-01 LAB — TYPE AND SCREEN
ABO/RH(D): A NEG
Antibody Screen: NEGATIVE

## 2023-06-01 LAB — HCG, SERUM, QUALITATIVE: Preg, Serum: NEGATIVE

## 2023-06-01 NOTE — ED Triage Notes (Signed)
 Patient report rectal bleeding x1 day. Patient report bright red blood on her stool today. Patient report she's been vomiting since last Saturday. Patient denies fever.

## 2023-06-01 NOTE — ED Provider Notes (Signed)
 [place] [facility] AT [facility] Provider Note   CSN: [id number] Arrival date & time: 06/01/23  1735     History  Chief Complaint  Patient presents with   Rectal Bleeding    Elizabeth Wong is a 43 y.o. female.  42 year old female presents with several days of right-sided abdominal discomfort.  No associated urinary symptoms.  Patient states that she has had painless bright red blood per rectum times several days.  Denies any weakness when she stands up.  No fever.  Went to urgent care and they were x-rayed by diverticulitis.  They put her on Cipro and Flagyl.  Also gave her a shot of unknown antibiotic.  Was told yesterday come to the ER but she waited till today.  She states the pain is in her right middle quadrant and radiates down towards her right lower quadrant.  She denies any vaginal bleeding or discharge.  No dysuria       Home Medications Prior to Admission medications   Medication Sig Start Date End Date Taking? Authorizing Provider  acetaminophen (TYLENOL) 500 MG tablet Take 1,000 mg by mouth every 6 (six) hours as needed for headache.     [provider]  buprenorphine (BUTRANS) 5 MCG/HR PTWK 1 patch once a week. 07/15/21   [provider]  HYDROcodone-acetaminophen (NORCO) 10-325 MG tablet Take 1 tablet by mouth 2 (two) times daily as needed. 06/24/19   [provider]  tiZANidine (ZANAFLEX) 4 MG tablet Take 4 mg by mouth 3 (three) times daily. 02/24/21   [provider]      Allergies    Patient has no known allergies.    Review of Systems   Review of Systems  All other systems reviewed and are negative.   Physical Exam Updated Vital Signs BP (!) 118/94 (BP Location: Left Arm)   Pulse 95   Temp 98.1 F (36.7 C) (Oral)   Resp 18   SpO2 98%  Physical Exam Vitals and nursing note reviewed. Exam conducted with a chaperone present.  Constitutional:      General: She is not in acute distress.     Appearance: Normal appearance. She is well-developed. She is not toxic-appearing.  HENT:     Head: Normocephalic and atraumatic.  Eyes:     General: Lids are normal.     Conjunctiva/sclera: Conjunctivae normal.     Pupils: Pupils are equal, round, and reactive to light.  Neck:     Thyroid: No thyroid mass.     Trachea: No tracheal deviation.  Cardiovascular:     Rate and Rhythm: Normal rate and regular rhythm.     Heart sounds: Normal heart sounds. No murmur heard.    No gallop.  Pulmonary:     Effort: Pulmonary effort is normal. No respiratory distress.     Breath sounds: Normal breath sounds. No stridor. No decreased breath sounds, wheezing, rhonchi or rales.  Abdominal:     General: There is no distension.     Palpations: Abdomen is soft.     Tenderness: There is abdominal tenderness in the right lower quadrant. There is no rebound.    Genitourinary:    Comments: No gross blood noted on DRE Musculoskeletal:        General: No tenderness. Normal range of motion.     Cervical back: Normal range of motion and neck supple.  Skin:    General: Skin is warm and dry.     Findings: No abrasion or  rash.  Neurological:     Mental Status: She is alert and oriented to person, place, and time. Mental status is at baseline.     GCS: GCS eye subscore is 4. GCS verbal subscore is 5. GCS motor subscore is 6.     Cranial Nerves: No cranial nerve deficit.     Sensory: No sensory deficit.     Motor: Motor function is intact.  Psychiatric:        Attention and Perception: Attention normal.        Speech: Speech normal.        Behavior: Behavior normal.     ED Results / Procedures / Treatments   Labs (all labs ordered are listed, but only abnormal results are displayed) Labs Reviewed  COMPREHENSIVE METABOLIC PANEL - Abnormal; Notable for the following components:      Result Value   Glucose, Bld 155 (*)    All other components within normal limits  CBC  HCG, SERUM, QUALITATIVE  TYPE  AND SCREEN  ABO/RH    EKG None  Radiology No results found.  Procedures Procedures    Medications Ordered in ED Medications - No data to display  ED Course/ Medical Decision Making/ A&P                                 Medical Decision Making Amount and/or Complexity of Data Reviewed Labs: ordered. Radiology: ordered.   Labs ordered as well as abdominal CT.'s are overall reassuring.  No evidence of active bleeding on digital rectal exam.  10:33 PM Informed by nursing staff that patient has eloped        Final Clinical Impression(s) / ED Diagnoses Final diagnoses:  None    Rx / DC Orders ED Discharge Orders     None         Lorre Nick, MD 06/01/23 2234

## 2023-06-01 NOTE — ED Notes (Signed)
 Pt walked out of room to triage and states that they are leaving and going to another hospital because no one is answering their call bell. Unable to stop pt from leaving
